# Patient Record
Sex: Female | Born: 1985 | Race: Black or African American | Hispanic: No | Marital: Married | Smoking: Never smoker
Health system: Southern US, Community
[De-identification: ages and names within clinical notes are randomized; demographics above are authoritative.]

## PROBLEM LIST (undated history)

## (undated) DIAGNOSIS — Z789 Other specified health status: Secondary | ICD-10-CM

## (undated) HISTORY — DX: Other specified health status: Z78.9

---

## 2013-01-05 LAB — OB RESULTS CONSOLE HEPATITIS B SURFACE ANTIGEN: Hepatitis B Surface Ag: NEGATIVE

## 2013-01-05 LAB — OB RESULTS CONSOLE RPR: RPR: NONREACTIVE

## 2013-01-05 LAB — OB RESULTS CONSOLE ABO/RH: RH Type: POSITIVE

## 2013-01-05 LAB — OB RESULTS CONSOLE RUBELLA ANTIBODY, IGM: Rubella: IMMUNE

## 2013-01-05 LAB — OB RESULTS CONSOLE ANTIBODY SCREEN: Antibody Screen: NEGATIVE

## 2013-01-27 ENCOUNTER — Inpatient Hospital Stay (HOSPITAL_COMMUNITY): Admission: AD | Admit: 2013-01-27 | Payer: Self-pay | Source: Ambulatory Visit | Admitting: Obstetrics and Gynecology

## 2013-07-16 LAB — OB RESULTS CONSOLE GBS: GBS: NEGATIVE

## 2013-08-02 ENCOUNTER — Ambulatory Visit (INDEPENDENT_AMBULATORY_CARE_PROVIDER_SITE_OTHER): Payer: Self-pay | Admitting: Pediatrics

## 2013-08-02 DIAGNOSIS — Z7189 Other specified counseling: Secondary | ICD-10-CM | POA: Insufficient documentation

## 2013-08-02 NOTE — Progress Notes (Signed)
Pre natal counseling done 

## 2013-08-04 ENCOUNTER — Encounter (HOSPITAL_COMMUNITY): Payer: Self-pay | Admitting: *Deleted

## 2013-08-04 ENCOUNTER — Telehealth (HOSPITAL_COMMUNITY): Payer: Self-pay | Admitting: *Deleted

## 2013-08-04 NOTE — Telephone Encounter (Signed)
Preadmission screen  

## 2013-08-05 ENCOUNTER — Encounter (HOSPITAL_COMMUNITY): Payer: Self-pay | Admitting: *Deleted

## 2013-08-05 ENCOUNTER — Inpatient Hospital Stay (HOSPITAL_COMMUNITY)
Admission: AD | Admit: 2013-08-05 | Discharge: 2013-08-05 | Disposition: A | Discharge status: Home / Self Care | Payer: BC Managed Care – PPO | Source: Ambulatory Visit | Attending: Obstetrics and Gynecology | Admitting: Obstetrics and Gynecology

## 2013-08-05 DIAGNOSIS — R51 Headache: Secondary | ICD-10-CM | POA: Insufficient documentation

## 2013-08-05 DIAGNOSIS — O479 False labor, unspecified: Secondary | ICD-10-CM | POA: Insufficient documentation

## 2013-08-05 DIAGNOSIS — O368190 Decreased fetal movements, unspecified trimester, not applicable or unspecified: Secondary | ICD-10-CM | POA: Insufficient documentation

## 2013-08-05 LAB — CBC
HCT: 35.4 % — ABNORMAL LOW (ref 36.0–46.0)
MCHC: 33.6 g/dL (ref 30.0–36.0)
MCV: 79.2 fL (ref 78.0–100.0)
Platelets: 244 10*3/uL (ref 150–400)
RBC: 4.47 MIL/uL (ref 3.87–5.11)
RDW: 14.4 % (ref 11.5–15.5)
WBC: 10 10*3/uL (ref 4.0–10.5)

## 2013-08-05 LAB — COMPREHENSIVE METABOLIC PANEL
ALT: 20 U/L (ref 0–35)
AST: 34 U/L (ref 0–37)
Albumin: 3.1 g/dL — ABNORMAL LOW (ref 3.5–5.2)
Alkaline Phosphatase: 148 U/L — ABNORMAL HIGH (ref 39–117)
BUN: 4 mg/dL — ABNORMAL LOW (ref 6–23)
Chloride: 101 mEq/L (ref 96–112)
Potassium: 3.9 mEq/L (ref 3.5–5.1)
Sodium: 135 mEq/L (ref 135–145)
Total Bilirubin: 0.3 mg/dL (ref 0.3–1.2)
Total Protein: 7.4 g/dL (ref 6.0–8.3)

## 2013-08-05 NOTE — MAU Note (Signed)
Patient is in with c/o ctx q2-3 mins for hours and bloody show. She denies LOF. C/o decreased fetal movement. She states that she was closed on Tuesday, denies any complications. Patient does have intermittent headache and epigastric discomfort at times.

## 2013-08-11 ENCOUNTER — Encounter (HOSPITAL_COMMUNITY): Payer: Self-pay | Admitting: Family

## 2013-08-11 ENCOUNTER — Inpatient Hospital Stay (HOSPITAL_COMMUNITY)
Admission: AD | Admit: 2013-08-11 | Discharge: 2013-08-14 | DRG: 766 | Disposition: A | Discharge status: Home / Self Care | Payer: BC Managed Care – PPO | Source: Ambulatory Visit | Attending: Obstetrics and Gynecology | Admitting: Obstetrics and Gynecology

## 2013-08-11 ENCOUNTER — Encounter (HOSPITAL_COMMUNITY)
Admission: AD | Disposition: A | Discharge status: Home / Self Care | Payer: Self-pay | Source: Ambulatory Visit | Attending: Obstetrics and Gynecology

## 2013-08-11 ENCOUNTER — Inpatient Hospital Stay (HOSPITAL_COMMUNITY): Admission: RE | Admit: 2013-08-11 | Payer: BC Managed Care – PPO | Source: Ambulatory Visit

## 2013-08-11 ENCOUNTER — Inpatient Hospital Stay (HOSPITAL_COMMUNITY): Payer: BC Managed Care – PPO | Admitting: Anesthesiology

## 2013-08-11 ENCOUNTER — Encounter (HOSPITAL_COMMUNITY): Payer: BC Managed Care – PPO | Admitting: Anesthesiology

## 2013-08-11 DIAGNOSIS — IMO0001 Reserved for inherently not codable concepts without codable children: Secondary | ICD-10-CM

## 2013-08-11 DIAGNOSIS — O368190 Decreased fetal movements, unspecified trimester, not applicable or unspecified: Secondary | ICD-10-CM | POA: Diagnosis present

## 2013-08-11 DIAGNOSIS — Z98891 History of uterine scar from previous surgery: Secondary | ICD-10-CM

## 2013-08-11 LAB — CBC
HCT: 39.3 % (ref 36.0–46.0)
Hemoglobin: 13.2 g/dL (ref 12.0–15.0)
MCH: 26.5 pg (ref 26.0–34.0)
MCHC: 33.6 g/dL (ref 30.0–36.0)
MCV: 78.8 fL (ref 78.0–100.0)
RBC: 4.99 MIL/uL (ref 3.87–5.11)
RDW: 14.5 % (ref 11.5–15.5)

## 2013-08-11 SURGERY — Surgical Case
Anesthesia: Spinal

## 2013-08-11 MED ORDER — FENTANYL CITRATE 0.05 MG/ML IJ SOLN
INTRAMUSCULAR | Status: DC | PRN
  Administered 2013-08-11: 25 ug via INTRATHECAL

## 2013-08-11 MED ORDER — ACETAMINOPHEN 325 MG PO TABS: 650.0000 mg | ORAL_TABLET | ORAL | Status: DC | PRN

## 2013-08-11 MED ORDER — CEFAZOLIN SODIUM-DEXTROSE 2-3 GM-% IV SOLR
INTRAVENOUS | Status: AC
  Filled 2013-08-11: qty 50

## 2013-08-11 MED ORDER — DIPHENHYDRAMINE HCL 25 MG PO CAPS: 25.0000 mg | ORAL_CAPSULE | Freq: Four times a day (QID) | ORAL | Status: DC | PRN

## 2013-08-11 MED ORDER — IBUPROFEN 600 MG PO TABS: 600.0000 mg | ORAL_TABLET | Freq: Four times a day (QID) | ORAL | Status: DC | PRN

## 2013-08-11 MED ORDER — NALOXONE HCL 1 MG/ML IJ SOLN
1.0000 ug/kg/h | INTRAVENOUS | Status: DC | PRN
  Filled 2013-08-11: qty 2

## 2013-08-11 MED ORDER — MORPHINE SULFATE (PF) 0.5 MG/ML IJ SOLN
INTRAMUSCULAR | Status: DC | PRN
  Administered 2013-08-11: .15 mg via INTRATHECAL

## 2013-08-11 MED ORDER — NALBUPHINE SYRINGE 5 MG/0.5 ML
5.0000 mg | INJECTION | INTRAMUSCULAR | Status: DC | PRN
  Filled 2013-08-11: qty 1

## 2013-08-11 MED ORDER — ONDANSETRON HCL 4 MG/2ML IJ SOLN: 4.0000 mg | Freq: Four times a day (QID) | INTRAMUSCULAR | Status: DC | PRN

## 2013-08-11 MED ORDER — FLEET ENEMA 7-19 GM/118ML RE ENEM: 1.0000 | ENEMA | RECTAL | Status: DC | PRN

## 2013-08-11 MED ORDER — PRENATAL MULTIVITAMIN CH
1.0000 | ORAL_TABLET | Freq: Every day | ORAL | Status: DC
  Administered 2013-08-12 – 2013-08-13 (×2): 1 via ORAL
  Filled 2013-08-11 (×2): qty 1

## 2013-08-11 MED ORDER — SIMETHICONE 80 MG PO CHEW
80.0000 mg | CHEWABLE_TABLET | ORAL | Status: DC | PRN
Start: 2013-08-11 — End: 2013-08-14

## 2013-08-11 MED ORDER — DIPHENHYDRAMINE HCL 50 MG/ML IJ SOLN: 12.5000 mg | INTRAMUSCULAR | Status: DC | PRN

## 2013-08-11 MED ORDER — LACTATED RINGERS IV SOLN
INTRAVENOUS | Status: DC
  Administered 2013-08-11: 21:00:00 via INTRAVENOUS

## 2013-08-11 MED ORDER — SCOPOLAMINE 1 MG/3DAYS TD PT72
1.0000 | MEDICATED_PATCH | Freq: Once | TRANSDERMAL | Status: DC
  Administered 2013-08-11: 1.5 mg via TRANSDERMAL

## 2013-08-11 MED ORDER — IBUPROFEN 600 MG PO TABS
600.0000 mg | ORAL_TABLET | Freq: Four times a day (QID) | ORAL | Status: DC
  Administered 2013-08-11 – 2013-08-14 (×11): 600 mg via ORAL
  Filled 2013-08-11 (×11): qty 1

## 2013-08-11 MED ORDER — ONDANSETRON HCL 4 MG/2ML IJ SOLN: 4.0000 mg | INTRAMUSCULAR | Status: DC | PRN

## 2013-08-11 MED ORDER — KETOROLAC TROMETHAMINE 30 MG/ML IJ SOLN
30.0000 mg | Freq: Four times a day (QID) | INTRAMUSCULAR | Status: AC | PRN
  Administered 2013-08-11: 30 mg via INTRAVENOUS

## 2013-08-11 MED ORDER — SIMETHICONE 80 MG PO CHEW
80.0000 mg | CHEWABLE_TABLET | ORAL | Status: DC
  Administered 2013-08-12 – 2013-08-14 (×3): 80 mg via ORAL
  Filled 2013-08-11 (×3): qty 1

## 2013-08-11 MED ORDER — PHENYLEPHRINE HCL 10 MG/ML IJ SOLN
INTRAMUSCULAR | Status: DC | PRN
  Administered 2013-08-11: 80 ug via INTRAVENOUS
  Administered 2013-08-11: 40 ug via INTRAVENOUS
  Administered 2013-08-11: 80 ug via INTRAVENOUS

## 2013-08-11 MED ORDER — CEFAZOLIN SODIUM-DEXTROSE 2-3 GM-% IV SOLR
INTRAVENOUS | Status: DC | PRN
  Administered 2013-08-11: 2 g via INTRAVENOUS

## 2013-08-11 MED ORDER — SENNOSIDES-DOCUSATE SODIUM 8.6-50 MG PO TABS
2.0000 | ORAL_TABLET | ORAL | Status: DC
  Administered 2013-08-12 – 2013-08-14 (×3): 2 via ORAL
  Filled 2013-08-11 (×3): qty 2

## 2013-08-11 MED ORDER — LACTATED RINGERS IV SOLN
INTRAVENOUS | Status: DC
  Administered 2013-08-11 (×2): via INTRAVENOUS

## 2013-08-11 MED ORDER — INFLUENZA VAC SPLIT QUAD 0.5 ML IM SUSP: 0.5000 mL | INTRAMUSCULAR | Status: DC

## 2013-08-11 MED ORDER — ONDANSETRON HCL 4 MG/2ML IJ SOLN
INTRAMUSCULAR | Status: DC | PRN
  Administered 2013-08-11: 4 mg via INTRAVENOUS

## 2013-08-11 MED ORDER — LANOLIN HYDROUS EX OINT: 1.0000 "application " | TOPICAL_OINTMENT | CUTANEOUS | Status: DC | PRN

## 2013-08-11 MED ORDER — OXYCODONE-ACETAMINOPHEN 5-325 MG PO TABS
1.0000 | ORAL_TABLET | ORAL | Status: DC | PRN
  Administered 2013-08-12 – 2013-08-14 (×5): 1 via ORAL
  Filled 2013-08-11 (×2): qty 1
  Filled 2013-08-11: qty 2
  Filled 2013-08-11 (×2): qty 1

## 2013-08-11 MED ORDER — ONDANSETRON HCL 4 MG/2ML IJ SOLN
INTRAMUSCULAR | Status: AC
  Filled 2013-08-11: qty 2

## 2013-08-11 MED ORDER — KETOROLAC TROMETHAMINE 30 MG/ML IJ SOLN: 30.0000 mg | Freq: Four times a day (QID) | INTRAMUSCULAR | Status: AC | PRN

## 2013-08-11 MED ORDER — ONDANSETRON HCL 4 MG/2ML IJ SOLN: 4.0000 mg | Freq: Three times a day (TID) | INTRAMUSCULAR | Status: DC | PRN

## 2013-08-11 MED ORDER — LACTATED RINGERS IV SOLN: 500.0000 mL | Freq: Once | INTRAVENOUS | Status: DC

## 2013-08-11 MED ORDER — BUPIVACAINE HCL (PF) 0.25 % IJ SOLN
INTRAMUSCULAR | Status: AC
  Filled 2013-08-11: qty 30

## 2013-08-11 MED ORDER — MEPERIDINE HCL 25 MG/ML IJ SOLN
INTRAMUSCULAR | Status: DC | PRN
  Administered 2013-08-11: 25 mg via INTRAVENOUS

## 2013-08-11 MED ORDER — WITCH HAZEL-GLYCERIN EX PADS: 1.0000 "application " | MEDICATED_PAD | CUTANEOUS | Status: DC | PRN

## 2013-08-11 MED ORDER — SCOPOLAMINE 1 MG/3DAYS TD PT72
MEDICATED_PATCH | TRANSDERMAL | Status: AC
  Administered 2013-08-11: 1.5 mg via TRANSDERMAL
  Filled 2013-08-11: qty 1

## 2013-08-11 MED ORDER — METOCLOPRAMIDE HCL 5 MG/ML IJ SOLN: 10.0000 mg | Freq: Three times a day (TID) | INTRAMUSCULAR | Status: DC | PRN

## 2013-08-11 MED ORDER — OXYTOCIN 10 UNIT/ML IJ SOLN
INTRAMUSCULAR | Status: AC
  Filled 2013-08-11: qty 4

## 2013-08-11 MED ORDER — OXYTOCIN 10 UNIT/ML IJ SOLN
40.0000 [IU] | INTRAVENOUS | Status: DC | PRN
  Administered 2013-08-11: 40 [IU] via INTRAVENOUS

## 2013-08-11 MED ORDER — LIDOCAINE HCL (PF) 1 % IJ SOLN: 30.0000 mL | INTRAMUSCULAR | Status: DC | PRN

## 2013-08-11 MED ORDER — OXYTOCIN 40 UNITS IN LACTATED RINGERS INFUSION - SIMPLE MED: 62.5000 mL/h | INTRAVENOUS | Status: AC

## 2013-08-11 MED ORDER — MEPERIDINE HCL 25 MG/ML IJ SOLN
INTRAMUSCULAR | Status: AC
  Filled 2013-08-11: qty 1

## 2013-08-11 MED ORDER — DIPHENHYDRAMINE HCL 25 MG PO CAPS
25.0000 mg | ORAL_CAPSULE | ORAL | Status: DC | PRN
  Filled 2013-08-11: qty 1

## 2013-08-11 MED ORDER — EPHEDRINE 5 MG/ML INJ: 10.0000 mg | INTRAVENOUS | Status: DC | PRN

## 2013-08-11 MED ORDER — LACTATED RINGERS IV SOLN
INTRAVENOUS | Status: DC | PRN
  Administered 2013-08-11: 14:00:00 via INTRAVENOUS

## 2013-08-11 MED ORDER — OXYCODONE-ACETAMINOPHEN 5-325 MG PO TABS: 1.0000 | ORAL_TABLET | ORAL | Status: DC | PRN

## 2013-08-11 MED ORDER — FENTANYL 2.5 MCG/ML BUPIVACAINE 1/10 % EPIDURAL INFUSION (WH - ANES): 14.0000 mL/h | INTRAMUSCULAR | Status: DC | PRN

## 2013-08-11 MED ORDER — TETANUS-DIPHTH-ACELL PERTUSSIS 5-2.5-18.5 LF-MCG/0.5 IM SUSP: 0.5000 mL | Freq: Once | INTRAMUSCULAR | Status: DC

## 2013-08-11 MED ORDER — PHENYLEPHRINE 40 MCG/ML (10ML) SYRINGE FOR IV PUSH (FOR BLOOD PRESSURE SUPPORT): 80.0000 ug | PREFILLED_SYRINGE | INTRAVENOUS | Status: DC | PRN

## 2013-08-11 MED ORDER — PHENYLEPHRINE 40 MCG/ML (10ML) SYRINGE FOR IV PUSH (FOR BLOOD PRESSURE SUPPORT)
PREFILLED_SYRINGE | INTRAVENOUS | Status: AC
  Filled 2013-08-11: qty 5

## 2013-08-11 MED ORDER — DIBUCAINE 1 % RE OINT: 1.0000 "application " | TOPICAL_OINTMENT | RECTAL | Status: DC | PRN

## 2013-08-11 MED ORDER — MORPHINE SULFATE 0.5 MG/ML IJ SOLN
INTRAMUSCULAR | Status: AC
  Filled 2013-08-11: qty 10

## 2013-08-11 MED ORDER — FENTANYL CITRATE 0.05 MG/ML IJ SOLN
INTRAMUSCULAR | Status: AC
  Filled 2013-08-11: qty 2

## 2013-08-11 MED ORDER — ONDANSETRON HCL 4 MG PO TABS: 4.0000 mg | ORAL_TABLET | ORAL | Status: DC | PRN

## 2013-08-11 MED ORDER — CITRIC ACID-SODIUM CITRATE 334-500 MG/5ML PO SOLN
30.0000 mL | ORAL | Status: DC | PRN
  Administered 2013-08-11: 30 mL via ORAL
  Filled 2013-08-11: qty 15

## 2013-08-11 MED ORDER — DIPHENHYDRAMINE HCL 50 MG/ML IJ SOLN: 25.0000 mg | INTRAMUSCULAR | Status: DC | PRN

## 2013-08-11 MED ORDER — NALOXONE HCL 0.4 MG/ML IJ SOLN: 0.4000 mg | INTRAMUSCULAR | Status: DC | PRN

## 2013-08-11 MED ORDER — LACTATED RINGERS IV SOLN: 500.0000 mL | INTRAVENOUS | Status: DC | PRN

## 2013-08-11 MED ORDER — SODIUM CHLORIDE 0.9 % IJ SOLN: 3.0000 mL | INTRAMUSCULAR | Status: DC | PRN

## 2013-08-11 MED ORDER — PHENYLEPHRINE 8 MG IN D5W 100 ML (0.08MG/ML) PREMIX OPTIME: INJECTION | INTRAVENOUS | Status: DC | PRN

## 2013-08-11 MED ORDER — OXYTOCIN BOLUS FROM INFUSION: 500.0000 mL | INTRAVENOUS | Status: DC

## 2013-08-11 MED ORDER — MENTHOL 3 MG MT LOZG: 1.0000 | LOZENGE | OROMUCOSAL | Status: DC | PRN

## 2013-08-11 MED ORDER — ZOLPIDEM TARTRATE 5 MG PO TABS: 5.0000 mg | ORAL_TABLET | Freq: Every evening | ORAL | Status: DC | PRN

## 2013-08-11 MED ORDER — OXYTOCIN 40 UNITS IN LACTATED RINGERS INFUSION - SIMPLE MED: 62.5000 mL/h | INTRAVENOUS | Status: DC

## 2013-08-11 MED ORDER — MEPERIDINE HCL 25 MG/ML IJ SOLN: 6.2500 mg | INTRAMUSCULAR | Status: DC | PRN

## 2013-08-11 MED ORDER — FENTANYL CITRATE 0.05 MG/ML IJ SOLN
25.0000 ug | INTRAMUSCULAR | Status: DC | PRN
  Administered 2013-08-11 (×2): 50 ug via INTRAVENOUS

## 2013-08-11 MED ORDER — FENTANYL CITRATE 0.05 MG/ML IJ SOLN
INTRAMUSCULAR | Status: AC
  Administered 2013-08-11: 50 ug via INTRAVENOUS
  Filled 2013-08-11: qty 2

## 2013-08-11 MED ORDER — KETOROLAC TROMETHAMINE 30 MG/ML IJ SOLN
INTRAMUSCULAR | Status: AC
  Administered 2013-08-11: 30 mg via INTRAVENOUS
  Filled 2013-08-11: qty 1

## 2013-08-11 MED ORDER — SIMETHICONE 80 MG PO CHEW
80.0000 mg | CHEWABLE_TABLET | Freq: Three times a day (TID) | ORAL | Status: DC
  Administered 2013-08-12 – 2013-08-14 (×5): 80 mg via ORAL
  Filled 2013-08-11 (×6): qty 1

## 2013-08-11 MED ORDER — BUPIVACAINE IN DEXTROSE 0.75-8.25 % IT SOLN
INTRATHECAL | Status: DC | PRN
  Administered 2013-08-11: 1.8 mL via INTRATHECAL

## 2013-08-11 SURGICAL SUPPLY — 30 items
BARRIER ADHS 3X4 INTERCEED (GAUZE/BANDAGES/DRESSINGS) IMPLANT
CLAMP CORD UMBIL (MISCELLANEOUS) IMPLANT
CLOTH BEACON ORANGE TIMEOUT ST (SAFETY) ×2 IMPLANT
CONTAINER PREFILL 10% NBF 15ML (MISCELLANEOUS) IMPLANT
DRAPE LG THREE QUARTER DISP (DRAPES) IMPLANT
DRSG OPSITE POSTOP 4X10 (GAUZE/BANDAGES/DRESSINGS) ×2 IMPLANT
DURAPREP 26ML APPLICATOR (WOUND CARE) ×2 IMPLANT
ELECT REM PT RETURN 9FT ADLT (ELECTROSURGICAL) ×2
ELECTRODE REM PT RTRN 9FT ADLT (ELECTROSURGICAL) ×1 IMPLANT
EXTRACTOR VACUUM M CUP 4 TUBE (SUCTIONS) IMPLANT
GLOVE BIO SURGEON STRL SZ 6.5 (GLOVE) ×2 IMPLANT
GOWN PREVENTION PLUS XLARGE (GOWN DISPOSABLE) ×4 IMPLANT
GOWN STRL REIN XL XLG (GOWN DISPOSABLE) ×4 IMPLANT
KIT ABG SYR 3ML LUER SLIP (SYRINGE) IMPLANT
NEEDLE HYPO 22GX1.5 SAFETY (NEEDLE) IMPLANT
NEEDLE HYPO 25X5/8 SAFETYGLIDE (NEEDLE) ×2 IMPLANT
NS IRRIG 1000ML POUR BTL (IV SOLUTION) ×2 IMPLANT
PACK C SECTION WH (CUSTOM PROCEDURE TRAY) ×2 IMPLANT
PAD OB MATERNITY 4.3X12.25 (PERSONAL CARE ITEMS) ×2 IMPLANT
STAPLER VISISTAT 35W (STAPLE) IMPLANT
SUT CHROMIC 0 CTX 36 (SUTURE) ×4 IMPLANT
SUT PLAIN 0 NONE (SUTURE) IMPLANT
SUT PLAIN 2 0 XLH (SUTURE) IMPLANT
SUT VIC AB 0 CT1 27 (SUTURE) ×3
SUT VIC AB 0 CT1 27XBRD ANBCTR (SUTURE) ×3 IMPLANT
SUT VIC AB 4-0 KS 27 (SUTURE) IMPLANT
SYR CONTROL 10ML LL (SYRINGE) IMPLANT
TOWEL OR 17X24 6PK STRL BLUE (TOWEL DISPOSABLE) ×2 IMPLANT
TRAY FOLEY CATH 14FR (SET/KITS/TRAYS/PACK) ×2 IMPLANT
WATER STERILE IRR 1000ML POUR (IV SOLUTION) ×2 IMPLANT

## 2013-08-11 NOTE — Progress Notes (Signed)
At time of AROM, thick dark meconium noted. FHR had absent variability and questionable late decels Contraction pattern consistent with tachysystole Cervix is only 2 cm dilated Given remote from vag delivery recommend Primary LTCS Risks reviewed OR notified.

## 2013-08-11 NOTE — Transfer of Care (Signed)
Immediate Anesthesia Transfer of Care Note  Patient: Jacqueline Bowen  Procedure(s) Performed: Procedure(s): CESAREAN SECTION (N/A)  Patient Location: PACU  Anesthesia Type:Spinal  Level of Consciousness: awake, alert  and oriented  Airway & Oxygen Therapy: Patient Spontanous Breathing  Post-op Assessment: Report given to PACU RN and Post -op Vital signs reviewed and stable  Post vital signs: Reviewed and stable  Complications: No apparent anesthesia complications

## 2013-08-11 NOTE — Lactation Note (Signed)
This note was copied from the chart of Jacqueline Annah Rosner. Lactation Consultation Note Initial visit at 3 hours of age, Baby asleep in the crib.  [facility] LC resources given and discussed.  Hand expression demonstrated with small drop of colostrum visible.  Discussed feeding cues, skin to skin, small stomach size, cluster feeding and output increasing.  Referred to baby and me booklet for more information.  Mom to call for assist as needed.   Patient Name: Jacqueline Bowen Today's Date: 08/11/2013     Maternal Data Formula Feeding for Exclusion: No Infant to breast within first hour of birth: Yes Has patient been taught Hand Expression?: Yes Does the patient have breastfeeding experience prior to this delivery?: No  Feeding Feeding Type: Breast Fed Length of feed: 5 min  LATCH Score/Interventions Latch: Too sleepy or reluctant, no latch achieved, no sucking elicited. Intervention(s): Skin to skin  Audible Swallowing: None Intervention(s): Skin to skin  Type of Nipple: Flat  Comfort (Breast/Nipple): Soft / non-tender     Hold (Positioning): Full assist, staff holds infant at breast  LATCH Score: 3  Lactation Tools Discussed/Used     Consult Status Consult Status: Follow-up Date: 08/12/13 Follow-up type: In-patient    Jannifer Rodney 08/11/2013, 5:26 PM

## 2013-08-11 NOTE — Anesthesia Postprocedure Evaluation (Signed)
  Anesthesia Post-op Note  Patient: Jacqueline Bowen  Procedure(s) Performed: Procedure(s): CESAREAN SECTION (N/A)  Patient Location: PACU  Anesthesia Type:Spinal  Level of Consciousness: awake, alert  and oriented  Airway and Oxygen Therapy: Patient Spontanous Breathing  Post-op Pain: none  Post-op Assessment: Post-op Vital signs reviewed, Patient's Cardiovascular Status Stable, Respiratory Function Stable, Patent Airway, No signs of Nausea or vomiting, Pain level controlled, No headache and No backache  Post-op Vital Signs: Reviewed and stable  Complications: No apparent anesthesia complications

## 2013-08-11 NOTE — H&P (Signed)
Lakita Quiggle is a 27 y.o. female presenting for labor. Maternal Medical History:  Reason for admission: Contractions.   Contractions: Onset was 3-5 hours ago.   Frequency: regular.   Perceived severity is strong.    Fetal activity: Perceived fetal activity is normal.    Prenatal complications: no prenatal complications   OB History   Grav Para Term Preterm Abortions TAB SAB Ect Mult Living   1              Past Medical History  Diagnosis Date  . Medical history non-contributory    Past Surgical History  Procedure Laterality Date  . No past surgeries     Family History: family history is negative for Alcohol abuse, Arthritis, Asthma, Birth defects, Cancer, COPD, Depression, Diabetes, Drug abuse, Early death, Hearing loss, Heart disease, Hyperlipidemia, Hypertension, Kidney disease, Learning disabilities, Mental illness, Mental retardation, Miscarriages / Stillbirths, Stroke, Vision loss, Varicose Veins, and Heart attack. Social History:  reports that she has never smoked. She has never used smokeless tobacco. She reports that she does not drink alcohol or use illicit drugs.   Prenatal Transfer Tool  Maternal Diabetes: No Genetic Screening: Normal Maternal Ultrasounds/Referrals: Normal Fetal Ultrasounds or other Referrals:  None Maternal Substance Abuse:  No Significant Maternal Medications:  None Significant Maternal Lab Results:  None Other Comments:  None  Review of Systems  All other systems reviewed and are negative.    Dilation: Fingertip Effacement (%): Thick;10;20 Station: -3 Exam by:: C.Millican, RN  Blood pressure 111/66, pulse 93, temperature 97.6 F (36.4 C), temperature source Oral, resp. rate 18, height 5\' 4"  (1.626 m), weight 89.086 kg (196 lb 6.4 oz), last menstrual period 10/30/2012. Maternal Exam:  Uterine Assessment: Contraction strength is moderate.  Contraction frequency is regular.   Abdomen: Fetal presentation: vertex  Introitus:  Normal vulva.   Fetal Exam Fetal State Assessment: Category I - tracings are normal.     Physical Exam  Nursing note and vitals reviewed. Constitutional: She appears well-developed.  HENT:  Head: Normocephalic.  Eyes: Pupils are equal, round, and reactive to light.  Neck: Normal range of motion.  Cardiovascular: Normal rate.   GI: Soft.    Prenatal labs: ABO, Rh: O/Positive/-- (05/13 0000) Antibody: Negative (05/13 0000) Rubella: Immune (05/13 0000) RPR: Nonreactive (05/13 0000)  HBsAg: Negative (05/13 0000)  HIV: Non-reactive (05/13 0000)  GBS: Negative (11/21 0000)   Assessment/Plan; IUP at term Labor Meconium Patient declines pain meds currently Follow tracing carefully Discussed meconium with patient and her spouse   Azalia Neuberger L 08/11/2013, 1:16 PM

## 2013-08-11 NOTE — Brief Op Note (Signed)
08/11/2013  2:36 PM  PATIENT:  Jacqueline Bowen  27 y.o. female  PRE-OPERATIVE DIAGNOSIS: IUP at 40 weeks and 5 days Thick Meconium Early Labor Non Reassuring Fetal Heart Rate  POST-OPERATIVE DIAGNOSIS:  Same  PROCEDURE:  Procedure(s): CESAREAN SECTION (N/A)  SURGEON:  Surgeon(s) and Role:    * Jeani Hawking, MD - Primary  PHYSICIAN ASSISTANT:   ASSISTANTS: none   ANESTHESIA:   spinal  EBL:  Total I/O In: -  Out: 800 [Urine:300; Blood:500]  BLOOD ADMINISTERED:none  DRAINS: Urinary Catheter (Foley)   LOCAL MEDICATIONS USED:  NONE  SPECIMEN:  Source of Specimen:  placenta and cord ph  DISPOSITION OF SPECIMEN:  PATHOLOGY  COUNTS:  YES  TOURNIQUET:  * No tourniquets in log *  DICTATION: .Other Dictation: Dictation Number [id number]  PLAN OF CARE: Admit to inpatient   PATIENT DISPOSITION:  PACU - hemodynamically stable.   Delay start of Pharmacological VTE agent (>24hrs) due to surgical blood loss or risk of bleeding: not applicable

## 2013-08-11 NOTE — Anesthesia Preprocedure Evaluation (Signed)
Anesthesia Evaluation  Patient identified by MRN, date of birth, ID band Patient awake    Reviewed: Allergy & Precautions, H&P , NPO status , Patient's Chart, lab work & pertinent test results  Airway Mallampati: IV TM Distance: >3 FB Neck ROM: Full    Dental no notable dental hx. (+) Teeth Intact   Pulmonary neg pulmonary ROS,  breath sounds clear to auscultation  Pulmonary exam normal       Cardiovascular negative cardio ROS  Rhythm:Regular Rate:Normal     Neuro/Psych negative neurological ROS  negative psych ROS   GI/Hepatic Neg liver ROS, GERD-  Medicated and Controlled,  Endo/Other  negative endocrine ROS  Renal/GU negative Renal ROS  negative genitourinary   Musculoskeletal negative musculoskeletal ROS (+)   Abdominal (+) + obese,   Peds  Hematology negative hematology ROS (+)   Anesthesia Other Findings   Reproductive/Obstetrics (+) Pregnancy Non reassuring FHR tracing                           Anesthesia Physical Anesthesia Plan  ASA: II and emergent  Anesthesia Plan: Spinal   Post-op Pain Management:    Induction:   Airway Management Planned: Natural Airway  Additional Equipment:   Intra-op Plan:   Post-operative Plan:   Informed Consent: I have reviewed the patients History and Physical, chart, labs and discussed the procedure including the risks, benefits and alternatives for the proposed anesthesia with the patient or authorized representative who has indicated his/her understanding and acceptance.     Plan Discussed with: Anesthesiologist, CRNA and Surgeon  Anesthesia Plan Comments:         Anesthesia Quick Evaluation

## 2013-08-11 NOTE — Anesthesia Procedure Notes (Signed)
Spinal  Patient location during procedure: OR Start time: 08/11/2013 2:04 PM Staffing Anesthesiologist: Kimberly Nieland A. Performed by: anesthesiologist  Preanesthetic Checklist Completed: patient identified, site marked, surgical consent, pre-op evaluation, timeout performed, IV checked, risks and benefits discussed and monitors and equipment checked Spinal Block Patient position: sitting Prep: site prepped and draped and DuraPrep Patient monitoring: heart rate, cardiac monitor, continuous pulse ox and blood pressure Approach: midline Location: L3-4 Injection technique: single-shot Needle Needle type: Sprotte  Needle gauge: 24 G Needle length: 9 cm Needle insertion depth: 5 cm Assessment Sensory level: T4 Additional Notes Patient tolerated procedure well. Adequate sensory level.

## 2013-08-11 NOTE — MAU Note (Signed)
27 yo, G1P0 at [redacted]w[redacted]d, scheduled for induction today at 29. Reports contractions 3-4 minutes apart since last night. Reports bloody show; decreased fetal movement noted today, although good +fm last night.  Denies HSV.

## 2013-08-11 NOTE — MAU Note (Signed)
Patient states she is having frequent contractions and had bloody show this am. States she felt fetal movement this am, less now.

## 2013-08-12 ENCOUNTER — Encounter (HOSPITAL_COMMUNITY): Payer: Self-pay | Admitting: Obstetrics and Gynecology

## 2013-08-12 LAB — CBC
Hemoglobin: 10.4 g/dL — ABNORMAL LOW (ref 12.0–15.0)
Platelets: 235 10*3/uL (ref 150–400)
RBC: 3.92 MIL/uL (ref 3.87–5.11)
RDW: 14.4 % (ref 11.5–15.5)
WBC: 15 10*3/uL — ABNORMAL HIGH (ref 4.0–10.5)

## 2013-08-12 NOTE — Op Note (Signed)
NAMECOLBI, SCHILTZ NO.:  [id number]  MEDICAL RECORD NO.:  [id number]  LOCATION:  9109                          FACILITY:  [facility]  PHYSICIAN:  Shantanu Strauch L. Lindley Hiney, M.D.DATE OF BIRTH:  1986/03/14  DATE OF PROCEDURE:  08/11/2013 DATE OF DISCHARGE:                              OPERATIVE REPORT   PREOPERATIVE DIAGNOSES: 1. IUP at 40 weeks and 5 days. 2. Thick meconium. 3. Early labor. 4. Nonreassuring fetal heart rate.  POSTOPERATIVE DIAGNOSES: 1. IUP at 40 weeks and 5 days. 2. Thick meconium. 3. Early labor. 4. Nonreassuring fetal heart rate.  PROCEDURE:  Primary low-transverse cesarean section.  SURGEON:  Dr. Vincente Poli.  ANESTHESIA:  Spinal.  EBL:  400 mL.  COMPLICATIONS:  None.  DRAINS:  Foley catheter.  PROCEDURE:  The patient was urgently taken to the operating room, #1 Dr. Malen Gauze placed a spinal.  She was prepped and draped.  Foley catheter was inserted.  A time-out was performed.  A low transverse incision was made, carried down to the fascia.  Fascia scored in the midline, extended laterally.  Rectus muscles were separated in the midline.  The peritoneum was entered bluntly.  The peritoneal incision was then divided.  The lower uterine segment was identified and the bladder flap was created sharply and then digitally.  A low-transverse incision was made in the uterus.  A very thick particulate yellow-green brown meconium was noted.  The baby was in cephalic presentation, delivered easily with one gentle pull of the vacuum.  It was a female infant.  The baby was meconium stained.  The cord was clamped and cut.  The baby was handed to the awaiting NICU team and the baby was vigorous in the operating room.  Cord blood and cord pH was obtained.  The placenta was manually removed, noted to be intact with a three-vessel cord.  The cord was very flat.  The cord and the placenta was meconium stained.  It was sent to pathology.  The uterus was  exteriorized and cleared of all clots and debris.  Pitocin was given.  Antibiotics had been given according to standard protocol.  The incision was closed in 2 layers using 0-chromic in a running locked stitch.  The uterus was returned to the abdomen. Irrigation was performed.  Hemostasis was excellent.  The peritoneum was closed using 0-Vicryl.  The fascia was then closed using 0-Vicryl starting each corner meeting in the midline.  After irrigation of subcutaneous layer, the skin was closed with 3-0 Vicryl on a Keith needle.  Dermabond was applied.  All sponge, lap, and instrument counts were correct x2.  The patient went to recovery room in stable condition.     Valerian Jewel L. Vincente Poli, M.D.     Florestine Avers  D:  08/11/2013  T:  08/12/2013  Job:  [id number]

## 2013-08-12 NOTE — Progress Notes (Signed)
Subjective: Postpartum Day 1: Cesarean Delivery Patient reports tolerating PO.    Objective: Vital signs in last 24 hours: Temp:  [97.4 F (36.3 C)-98.4 F (36.9 C)] 97.6 F (36.4 C) (12/18 1610) Pulse Rate:  [72-105] 85 (12/18 0638) Resp:  [16-20] 18 (12/18 9604) BP: (88-135)/(40-83) 110/67 mmHg (12/18 0638) SpO2:  [97 %-100 %] 98 % (12/18 0315) Weight:  [195 lb (88.451 kg)-196 lb 6.4 oz (89.086 kg)] 195 lb (88.451 kg) (12/17 1700)  Physical Exam:  General: alert and cooperative Lochia: appropriate Uterine Fundus: firm Incision: honeycomb dressing CDI DVT Evaluation: No evidence of DVT seen on physical exam. Negative Homan's sign. No cords or calf tenderness. No significant calf/ankle edema.   Recent Labs  08/11/13 1235 08/12/13 0600  HGB 13.2 10.4*  HCT 39.3 31.3*    Assessment/Plan: Status post Cesarean section. Doing well postoperatively.  Continue current care.  Mahalia Dykes G 08/12/2013, 8:00 AM

## 2013-08-13 NOTE — Lactation Note (Signed)
This note was copied from the chart of Jacqueline Bowen. Lactation Consultation Note Follow up visit at 50 hours,  Mom denies pain or problems.  Latch scores 8-9, mom reports hearing swallows.  Baby has not stooled in > 24 hours. Encouraged mom to follow up with [facility] RN if no stool tonight. Breast feeding basics reviewed. Mom hand expressed large amounts of colostrum. Mom to call for assist as needed.   Patient Name: Jacqueline Margurette Brener [id number] Date: 08/13/2013 Reason for consult: Follow-up assessment   Maternal Data    Feeding Feeding Type: Breast Fed Length of feed: 20 min  LATCH Score/Interventions Latch: Grasps breast easily, tongue down, lips flanged, rhythmical sucking.  Audible Swallowing: A few with stimulation  Type of Nipple: Everted at rest and after stimulation  Comfort (Breast/Nipple): Soft / non-tender     Hold (Positioning): No assistance needed to correctly position infant at breast. Intervention(s): Breastfeeding basics reviewed  LATCH Score: 9  Lactation Tools Discussed/Used     Consult Status Consult Status: Follow-up Date: 08/14/13 Follow-up type: In-patient    Jannifer Rodney 08/13/2013, 4:41 PM

## 2013-08-13 NOTE — Progress Notes (Signed)
Subjective: Postpartum Day 2: Cesarean Delivery Patient reports tolerating PO, + flatus and no problems voiding.    Objective: Vital signs in last 24 hours: Temp:  [97.8 F (36.6 C)-98.5 F (36.9 C)] 98.5 F (36.9 C) (12/19 0532) Pulse Rate:  [82-102] 82 (12/19 0532) Resp:  [16-20] 18 (12/19 0532) BP: (95-105)/(61-63) 95/63 mmHg (12/19 0532) SpO2:  [98 %] 98 % (12/18 1100)  Physical Exam:  General: alert and cooperative Lochia: appropriate Uterine Fundus: firm Incision: healing well DVT Evaluation: No evidence of DVT seen on physical exam. Negative Homan's sign. No cords or calf tenderness. No significant calf/ankle edema.   Recent Labs  08/11/13 1235 08/12/13 0600  HGB 13.2 10.4*  HCT 39.3 31.3*    Assessment/Plan: Status post Cesarean section. Doing well postoperatively.  Continue current care.  Richie Vadala G 08/13/2013, 8:11 AM

## 2013-08-14 MED ORDER — IBUPROFEN 600 MG PO TABS: 600.0000 mg | ORAL_TABLET | Freq: Four times a day (QID) | ORAL | Status: DC

## 2013-08-14 MED ORDER — OXYCODONE-ACETAMINOPHEN 5-325 MG PO TABS: 1.0000 | ORAL_TABLET | ORAL | Status: DC | PRN

## 2013-08-14 NOTE — Lactation Note (Signed)
This note was copied from the chart of Girl Ladawna Hoecker. Lactation Consultation Note: Mom reports that baby just finished feeding and she is asleep in mom's arms. Reports that baby is latching on well- a little pain at the beginning of the feeding then eases off. Asking about pumping and bottle feeding EBM and milk storage- discussed with parents. Has Medela pump at home. Reports that her breasts are feeling slightly fuller this morning. No further questions at present. To call prn  Patient Name: Girl Oceania Noori [id number] Date: 08/14/2013 Reason for consult: Follow-up assessment   Maternal Data    Feeding Feeding Type: Breast Fed Length of feed: 10 min  LATCH Score/Interventions                      Lactation Tools Discussed/Used     Consult Status Consult Status: Complete    Pamelia Hoit 08/14/2013, 8:16 AM

## 2013-08-14 NOTE — Discharge Summary (Signed)
Obstetric Discharge Summary Reason for Admission: onset of labor Prenatal Procedures: none Intrapartum Procedures: cesarean: low cervical, transverse Postpartum Procedures: none Complications-Operative and Postpartum: none Hemoglobin  Date Value Range Status  08/12/2013 10.4* 12.0 - 15.0 g/dL Final     DELTA CHECK NOTED     REPEATED TO VERIFY     HCT  Date Value Range Status  08/12/2013 31.3* 36.0 - 46.0 % Final    Physical Exam:  General: alert Lochia: appropriate Uterine Fundus: firm Incision: healing well DVT Evaluation: No evidence of DVT seen on physical exam.  Discharge Diagnoses: Term Pregnancy-delivered  Discharge Information: Date: 08/14/2013 Activity: pelvic rest Diet: routine Medications: PNV, Ibuprofen and Percocet Condition: stable Instructions: refer to practice specific booklet Discharge to: home Follow-up Information   Follow up with Meriel Pica, MD. (has appt 12/24)    Specialty:  Obstetrics and Gynecology   Contact information:   [address] [place] [address] [phone]       Newborn Data: Live born female  Birth Weight: 7 lb 7.2 oz (3380 g) APGAR: 8, 9  Home with mother.  Murrell Elizondo M 08/14/2013, 11:15 AM

## 2013-08-18 ENCOUNTER — Encounter: Payer: Self-pay | Admitting: Pediatrics

## 2013-08-21 ENCOUNTER — Inpatient Hospital Stay (HOSPITAL_COMMUNITY)
Admission: AD | Admit: 2013-08-21 | Discharge: 2013-08-22 | Disposition: A | Discharge status: Home / Self Care | Payer: BC Managed Care – PPO | Source: Ambulatory Visit | Attending: Obstetrics and Gynecology | Admitting: Obstetrics and Gynecology

## 2013-08-21 DIAGNOSIS — R109 Unspecified abdominal pain: Secondary | ICD-10-CM | POA: Insufficient documentation

## 2013-08-21 NOTE — MAU Note (Signed)
I had C/S on 12/17. About 30 mins ago I had a lot of bleeding and passed some large clots. Having some cramping. Bleeding has slowed alittle now

## 2013-08-22 ENCOUNTER — Encounter (HOSPITAL_COMMUNITY): Payer: Self-pay | Admitting: *Deleted

## 2013-08-22 ENCOUNTER — Inpatient Hospital Stay (HOSPITAL_COMMUNITY): Payer: BC Managed Care – PPO

## 2013-08-22 LAB — CBC WITH DIFFERENTIAL/PLATELET
Basophils Absolute: 0 10*3/uL (ref 0.0–0.1)
Basophils Relative: 0 % (ref 0–1)
Lymphocytes Relative: 10 % — ABNORMAL LOW (ref 12–46)
Lymphs Abs: 1.3 10*3/uL (ref 0.7–4.0)
MCHC: 33.1 g/dL (ref 30.0–36.0)
MCV: 79.7 fL (ref 78.0–100.0)
Neutro Abs: 10.9 10*3/uL — ABNORMAL HIGH (ref 1.7–7.7)
Neutrophils Relative %: 84 % — ABNORMAL HIGH (ref 43–77)
Platelets: 376 10*3/uL (ref 150–400)
RBC: 3.6 MIL/uL — ABNORMAL LOW (ref 3.87–5.11)
WBC: 13.1 10*3/uL — ABNORMAL HIGH (ref 4.0–10.5)

## 2013-08-22 MED ORDER — IBUPROFEN 600 MG PO TABS
600.0000 mg | ORAL_TABLET | Freq: Once | ORAL | Status: AC
  Administered 2013-08-22: 600 mg via ORAL
  Filled 2013-08-22: qty 1

## 2013-08-22 MED ORDER — METHYLERGONOVINE MALEATE 0.2 MG PO TABS
0.2000 mg | ORAL_TABLET | Freq: Once | ORAL | Status: AC
  Administered 2013-08-22: 0.2 mg via ORAL
  Filled 2013-08-22: qty 1

## 2013-08-22 MED ORDER — METHYLERGONOVINE MALEATE 0.2 MG PO TABS: 0.2000 mg | ORAL_TABLET | Freq: Four times a day (QID) | ORAL | Status: AC

## 2013-08-22 NOTE — MAU Note (Signed)
Incision clean and dry and edges well approximated

## 2013-08-22 NOTE — Progress Notes (Signed)
Ivonne Andrew CNM in earlier and discussed test results and d/c plan. Written and verbal d/c instructions given and understanding voicedl.

## 2013-08-22 NOTE — MAU Provider Note (Signed)
Chief Complaint: Vaginal Bleeding   First Provider Initiated Contact with Patient 08/22/13 0325     SUBJECTIVE HPI: Jacqueline Bowen is a 27 y.o. G1P1001 at 10 days S/P primary LTCS for NRFHTs who presents to MAU with heavy bleeding and passing clots since 30 minutes prior to MAU visit. Less bleeing since then, but still moderate. Mild cramping. Denies fever, chills, significant abd pain, problems w/ incision, passage of tissue, N/V/D/C, urinary complaints or dizziness.   Past Medical History  Diagnosis Date  . Medical history non-contributory    OB History  Gravida Para Term Preterm AB SAB TAB Ectopic Multiple Living  1 1 1       1     # Outcome Date GA Lbr Len/2nd Weight Sex Delivery Anes PTL Lv  1 TRM 08/11/13 [redacted]w[redacted]d  3.38 kg (7 lb 7.2 oz) F LTCS Spinal  Y     Past Surgical History  Procedure Laterality Date  . No past surgeries    . Cesarean section N/A 08/11/2013    Procedure: CESAREAN SECTION;  Surgeon: Jeani Hawking, MD;  Location: [facility];  Service: Obstetrics;  Laterality: N/A;   History   Social History  . Marital Status: Married    Spouse Name: N/A    Number of Children: N/A  . Years of Education: N/A   Occupational History  . Not on file.   Social History Main Topics  . Smoking status: Never Smoker   . Smokeless tobacco: Never Used  . Alcohol Use: No  . Drug Use: No  . Sexual Activity: No   Other Topics Concern  . Not on file   Social History Narrative  . No narrative on file   No current facility-administered medications on file prior to encounter.   Current Outpatient Prescriptions on File Prior to Encounter  Medication Sig Dispense Refill  . ibuprofen (ADVIL,MOTRIN) 600 MG tablet Take 1 tablet (600 mg total) by mouth every 6 (six) hours.  30 tablet  1  . oxyCODONE-acetaminophen (PERCOCET/ROXICET) 5-325 MG per tablet Take 1-2 tablets by mouth every 4 (four) hours as needed for severe pain (moderate - severe pain).  30 tablet  0  . Prenatal Vit-Fe  Fumarate-FA (PRENATAL MULTIVITAMIN) TABS tablet Take 1 tablet by mouth daily at 12 noon.       No Known Allergies  ROS: Pertinent items in HPI  OBJECTIVE Blood pressure 121/80, pulse 87, temperature 98.1 F (36.7 C), temperature source Oral, resp. rate 18, height 5\' 5"  (1.651 m), weight 82.555 kg (182 lb), last menstrual period 10/30/2012. GENERAL: Well-developed, well-nourished female in no acute distress. Normal color for race. HEENT: Normocephalic HEART: normal rate RESP: normal effort ABDOMEN: Soft, non-tender. Well-healing LTCS incision. No erythema, drainage or bleeding.  EXTREMITIES: Nontender, no edema NEURO: Alert and oriented SPECULUM EXAM: NEFG, small amount of dark red blood and 1 2x4 cm clot. Normal odor, cervix clean BIMANUAL: cervix closed; uterus 13-week size, mildly tender.   LAB RESULTS Results for orders placed during the hospital encounter of 08/21/13 (from the past 24 hour(s))  CBC WITH DIFFERENTIAL     Status: Abnormal   Collection Time    08/22/13 12:19 AM      Result Value Range   WBC 13.1 (*) 4.0 - 10.5 K/uL   RBC 3.60 (*) 3.87 - 5.11 MIL/uL   Hemoglobin 9.5 (*) 12.0 - 15.0 g/dL   HCT 16.1 (*) 09.6 - 04.5 %   MCV 79.7  78.0 - 100.0 fL   MCH 26.4  26.0 - 34.0 pg   MCHC 33.1  30.0 - 36.0 g/dL   RDW 91.4  78.2 - 95.6 %   Platelets 376  150 - 400 K/uL   Neutrophils Relative % 84 (*) 43 - 77 %   Neutro Abs 10.9 (*) 1.7 - 7.7 K/uL   Lymphocytes Relative 10 (*) 12 - 46 %   Lymphs Abs 1.3  0.7 - 4.0 K/uL   Monocytes Relative 6  3 - 12 %   Monocytes Absolute 0.8  0.1 - 1.0 K/uL   Eosinophils Relative 1  0 - 5 %   Eosinophils Absolute 0.1  0.0 - 0.7 K/uL   Basophils Relative 0  0 - 1 %   Basophils Absolute 0.0  0.0 - 0.1 K/uL    IMAGING US Transvaginal Non-ob  08/22/2013   CLINICAL DATA:  Vaginal bleeding and pelvic cramping. Assess for retained products of conception.  EXAM: TRANSVAGINAL ULTRASOUND OF PELVIS  TECHNIQUE: Transvaginal ultrasound  examination of the pelvis was performed including evaluation of the uterus, ovaries, adnexal regions, and pelvic cul-de-sac.  COMPARISON:  None.  FINDINGS: Uterus  Measurements: 13.2 x 6.4 x 8.5 cm. No fibroids or other mass visualized. Complex fluid is noted within the endometrial canal. Clot is seen extending to the C-section incision.  Endometrium  Thickness: Not well characterized. No focal abnormality visualized.  Right ovary  Not visualized.  Left ovary  Not visualized.  Other findings:  No free fluid is seen within the pelvic cul-de-sac.  IMPRESSION: No evidence of retained products of conception. Complex fluid noted within the endometrial canal. Clot seen extending to the C-section incision.   Electronically Signed   By: Roanna Raider M.D.   On: 08/22/2013 03:22   Normal findings for 10 week post-op C/S per radiologist.   MAU COURSE Pelvic US, Methergine Ibuprofen. Small amount of bleeding for remainder of MAU visit.   ASSESSMENT 1. Abnormal uterine bleeding, postpartum    PLAN Discharge home in stable condition. Bleeding precautions.  Take Methergine every 6 hours for the next 24 hours. You will probably have cramping after each dose. Take Ibuprofen as needed. Call you doctor's office or come to MAU for fever higher than 100.4, heavy bleeding or worsening abdominal pain.  Increase fluids and iron-rich foods.      Follow-up Information   Follow up with [facility] [place]. On 09/15/2013. (as scheduled or as needed if symptoms worsen)    Contact information:   [address] [place] [address] [phone]      Follow up with [facility] [place] [facility]ERN[facility] ADMISSIONS. (As needed if symptoms worsen)    Contact information:   [id number] [place] [address] [phone]       Medication List         ibuprofen 600 MG tablet  Commonly known as:  ADVIL,MOTRIN  Take 1 tablet (600 mg total) by mouth  every 6 (six) hours.     methylergonovine 0.2 MG tablet  Commonly known as:  METHERGINE  Take 1 tablet (0.2 mg total) by mouth 4 (four) times daily.     oxyCODONE-acetaminophen 5-325 MG per tablet  Commonly known as:  PERCOCET/ROXICET  Take 1-2 tablets by mouth every 4 (four) hours as needed for severe pain (moderate - severe pain).     prenatal multivitamin Tabs tablet  Take 1 tablet by mouth daily at 12 noon.        [place]  Elvina Mattes 08/22/2013  4:35 AM

## 2013-08-28 ENCOUNTER — Encounter: Payer: Self-pay | Admitting: Pediatrics

## 2014-06-27 ENCOUNTER — Encounter (HOSPITAL_COMMUNITY): Payer: Self-pay | Admitting: *Deleted

## 2015-07-21 ENCOUNTER — Inpatient Hospital Stay (HOSPITAL_COMMUNITY): Payer: Medicaid Other

## 2015-07-21 ENCOUNTER — Encounter (HOSPITAL_COMMUNITY): Payer: Self-pay

## 2015-07-21 ENCOUNTER — Inpatient Hospital Stay (HOSPITAL_COMMUNITY)
Admission: AD | Admit: 2015-07-21 | Discharge: 2015-07-21 | Disposition: A | Discharge status: Home / Self Care | Payer: Medicaid Other | Source: Ambulatory Visit | Attending: Family Medicine | Admitting: Family Medicine

## 2015-07-21 DIAGNOSIS — O209 Hemorrhage in early pregnancy, unspecified: Secondary | ICD-10-CM | POA: Diagnosis present

## 2015-07-21 DIAGNOSIS — O3680X0 Pregnancy with inconclusive fetal viability, not applicable or unspecified: Secondary | ICD-10-CM

## 2015-07-21 DIAGNOSIS — O200 Threatened abortion: Secondary | ICD-10-CM | POA: Diagnosis not present

## 2015-07-21 LAB — URINE MICROSCOPIC-ADD ON: WBC UA: NONE SEEN WBC/hpf (ref 0–5)

## 2015-07-21 LAB — CBC
HCT: 34.7 % — ABNORMAL LOW (ref 36.0–46.0)
HEMOGLOBIN: 11.4 g/dL — AB (ref 12.0–15.0)
MCH: 26.3 pg (ref 26.0–34.0)
MCHC: 32.9 g/dL (ref 30.0–36.0)
MCV: 80 fL (ref 78.0–100.0)
Platelets: 232 10*3/uL (ref 150–400)
RBC: 4.34 MIL/uL (ref 3.87–5.11)
RDW: 13.1 % (ref 11.5–15.5)
WBC: 4 10*3/uL (ref 4.0–10.5)

## 2015-07-21 LAB — URINALYSIS, ROUTINE W REFLEX MICROSCOPIC
BILIRUBIN URINE: NEGATIVE
Glucose, UA: NEGATIVE mg/dL
Ketones, ur: NEGATIVE mg/dL
Leukocytes, UA: NEGATIVE
Nitrite: NEGATIVE
PH: 6 (ref 5.0–8.0)
Protein, ur: NEGATIVE mg/dL
SPECIFIC GRAVITY, URINE: 1.025 (ref 1.005–1.030)

## 2015-07-21 LAB — HCG, QUANTITATIVE, PREGNANCY: hCG, Beta Chain, Quant, S: 11438 m[IU]/mL — ABNORMAL HIGH (ref <5)

## 2015-07-21 LAB — POCT PREGNANCY, URINE: PREG TEST UR: POSITIVE — AB

## 2015-07-21 NOTE — MAU Note (Signed)
Patient had positive home UPT, started having light vaginal bleeding since last night.

## 2015-07-21 NOTE — Discharge Instructions (Signed)

## 2015-07-22 LAB — HIV ANTIBODY (ROUTINE TESTING W REFLEX): HIV Screen 4th Generation wRfx: NONREACTIVE

## 2015-07-23 ENCOUNTER — Inpatient Hospital Stay (HOSPITAL_COMMUNITY)
Admission: AD | Admit: 2015-07-23 | Discharge: 2015-07-23 | Disposition: A | Discharge status: Home / Self Care | Payer: Medicaid Other | Source: Ambulatory Visit | Attending: Obstetrics & Gynecology | Admitting: Obstetrics & Gynecology

## 2015-07-23 ENCOUNTER — Encounter (HOSPITAL_COMMUNITY): Payer: Self-pay

## 2015-07-23 DIAGNOSIS — O021 Missed abortion: Secondary | ICD-10-CM | POA: Diagnosis not present

## 2015-07-23 DIAGNOSIS — Z3A14 14 weeks gestation of pregnancy: Secondary | ICD-10-CM | POA: Diagnosis not present

## 2015-07-23 LAB — CBC
HCT: 35.4 % — ABNORMAL LOW (ref 36.0–46.0)
Hemoglobin: 11.6 g/dL — ABNORMAL LOW (ref 12.0–15.0)
MCH: 26.2 pg (ref 26.0–34.0)
MCHC: 32.8 g/dL (ref 30.0–36.0)
MCV: 80.1 fL (ref 78.0–100.0)
PLATELETS: 239 10*3/uL (ref 150–400)
RBC: 4.42 MIL/uL (ref 3.87–5.11)
RDW: 13.2 % (ref 11.5–15.5)
WBC: 5.8 10*3/uL (ref 4.0–10.5)

## 2015-07-23 LAB — URINALYSIS, ROUTINE W REFLEX MICROSCOPIC
BILIRUBIN URINE: NEGATIVE
Glucose, UA: NEGATIVE mg/dL
Ketones, ur: NEGATIVE mg/dL
Leukocytes, UA: NEGATIVE
Nitrite: NEGATIVE
PH: 5.5 (ref 5.0–8.0)
Protein, ur: NEGATIVE mg/dL
SPECIFIC GRAVITY, URINE: 1.025 (ref 1.005–1.030)

## 2015-07-23 LAB — URINE MICROSCOPIC-ADD ON
BACTERIA UA: NONE SEEN
WBC, UA: NONE SEEN WBC/hpf (ref 0–5)

## 2015-07-23 LAB — HCG, QUANTITATIVE, PREGNANCY: HCG, BETA CHAIN, QUANT, S: 8983 m[IU]/mL — AB (ref <5)

## 2015-07-23 MED ORDER — OXYCODONE-ACETAMINOPHEN 5-325 MG PO TABS: 1.0000 | ORAL_TABLET | Freq: Four times a day (QID) | ORAL | Status: DC | PRN

## 2015-07-23 MED ORDER — IBUPROFEN 600 MG PO TABS: 600.0000 mg | ORAL_TABLET | Freq: Four times a day (QID) | ORAL | Status: DC | PRN

## 2015-07-23 NOTE — MAU Note (Signed)
Pt states having some lower abdominal pain and heavier bleeding. Passed some small clots yesterday.

## 2015-07-23 NOTE — Discharge Instructions (Signed)
Incomplete Miscarriage A miscarriage is the sudden loss of an unborn baby (fetus) before the 20th week of pregnancy. In an incomplete miscarriage, parts of the fetus or placenta (afterbirth) remain in the body.  Having a miscarriage can be an emotional experience. Talk with your health care provider about any questions you may have about miscarrying, the grieving process, and your future pregnancy plans. CAUSES   Problems with the fetal chromosomes that make it impossible for the baby to develop normally. Problems with the baby's genes or chromosomes are most often the result of errors that occur by chance as the embryo divides and grows. The problems are not inherited from the parents.  Infection of the cervix or uterus.  Hormone problems.  Problems with the cervix, such as having an incompetent cervix. This is when the tissue in the cervix is not strong enough to hold the pregnancy.  Problems with the uterus, such as an abnormally shaped uterus, uterine fibroids, or congenital abnormalities.  Certain medical conditions.  Smoking, drinking alcohol, or taking illegal drugs.  Trauma. SYMPTOMS   Vaginal bleeding or spotting, with or without cramps or pain.  Pain or cramping in the abdomen or lower back.  Passing fluid, tissue, or blood clots from the vagina. DIAGNOSIS  Your health care provider will perform a physical exam. You may also have an ultrasound to confirm the miscarriage. Blood or urine tests may also be ordered. TREATMENT   Usually, a dilation and curettage (D&C) procedure is performed. During a D&C procedure, the cervix is widened (dilated) and any remaining fetal or placental tissue is gently removed from the uterus.  Antibiotic medicines are prescribed if there is an infection. Other medicines may be given to reduce the size of the uterus (contract) if there is a lot of bleeding.  If you have Rh negative blood and your baby was Rh positive, you will need a Rho (D)  immune globulin shot. This shot will protect any future baby from having Rh blood problems in future pregnancies.  You may be confined to bed rest. This means you should stay in bed and only get up to use the bathroom. HOME CARE INSTRUCTIONS   Rest as directed by your health care provider.  Restrict activity as directed by your health care provider. You may be allowed to continue light activity if curettage was not done but you require further treatment.  Keep track of the number of pads you use each day. Keep track of how soaked (saturated) they are. Record this information.  Do not  use tampons.  Do not douche or have sexual intercourse until approved by your health care provider.  Keep all follow-up appointments for reevaluation and continuing management.  Only take over-the-counter or prescription medicines for pain, fever, or discomfort as directed by your health care provider.  Take antibiotic medicine as directed by your health care provider. Make sure you finish it even if you start to feel better. SEEK IMMEDIATE MEDICAL CARE IF:   You experience severe cramps in your stomach, back, or abdomen.  You have an unexplained temperature (make sure to record these temperatures).  You pass large clots or tissue (save these for your health care provider to inspect).  Your bleeding increases.  You become light-headed, weak, or have fainting episodes. MAKE SURE YOU:   Understand these instructions.  Will watch your condition.  Will get help right away if you are not doing well or get worse.   This information is not intended to   replace advice given to you by your health care provider. Make sure you discuss any questions you have with your health care provider.   Document Released: 08/12/2005 Document Revised: 09/02/2014 Document Reviewed: 03/11/2013 Elsevier Interactive Patient Education 2016 [employer]vier Inc.  

## 2015-07-23 NOTE — MAU Provider Note (Signed)
History     CSN: [id number]  Arrival date and time: 07/23/15 [id number]   First Provider Initiated Contact with Patient 07/23/15 [phone]      Chief Complaint  Patient presents with  . Abdominal Pain   HPI [name] is a 29 y.o. G3P1001 at 43110w3d who presents to MAU today with complaint of vaginal bleeding or abdominal pain. The patient was seen in MAU with the same symptoms on 07/21/15. US that day showed IUGS without YS or FP and quant hCG was [id number]. She states that bleeding was heavier last night and early this morning with few small clots. She feels it is lighter now. She continues to have pain off and on that comes in waves. She has not taken anything for pain. She denies fever.   OB History    Gravida Para Term Preterm AB TAB SAB Ectopic Multiple Living   2 1 1       1       Past Medical History  Diagnosis Date  . Medical history non-contributory     Past Surgical History  Procedure Laterality Date  . No past surgeries    . Cesarean section N/A 08/11/2013    Procedure: CESAREAN SECTION;  Surgeon: Jeani HawkingMichelle L Grewal, MD;  Location: [facility];  Service: Obstetrics;  Laterality: N/A;    Family History  Problem Relation Age of Onset  . Alcohol abuse Neg Hx   . Arthritis Neg Hx   . Asthma Neg Hx   . Birth defects Neg Hx   . Cancer Neg Hx   . COPD Neg Hx   . Depression Neg Hx   . Diabetes Neg Hx   . Drug abuse Neg Hx   . Early death Neg Hx   . Hearing loss Neg Hx   . Heart disease Neg Hx   . Hyperlipidemia Neg Hx   . Hypertension Neg Hx   . Kidney disease Neg Hx   . Learning disabilities Neg Hx   . Mental illness Neg Hx   . Mental retardation Neg Hx   . Miscarriages / Stillbirths Neg Hx   . Stroke Neg Hx   . Vision loss Neg Hx   . Varicose Veins Neg Hx   . Heart attack Neg Hx     Social History  Substance Use Topics  . Smoking status: Never Smoker   . Smokeless tobacco: Never Used  . Alcohol Use: No    Allergies: No Known Allergies  Prescriptions  prior to admission  Medication Sig Dispense Refill Last Dose  . Prenatal Vit-Fe Fumarate-FA (PRENATAL MULTIVITAMIN) TABS tablet Take 1 tablet by mouth daily at 12 noon.   07/22/2015 at Unknown time    Review of Systems  Constitutional: Negative for fever and malaise/fatigue.  Gastrointestinal: Positive for abdominal pain. Negative for nausea and vomiting.  Genitourinary:       + vaginal bleeding   Physical Exam   Blood pressure 114/69, pulse 77, temperature 97.4 F (36.3 C), temperature source Oral, resp. rate 16, height 5\' 5"  (1.651 m), weight 151 lb 6.4 oz (68.675 kg), last menstrual period 05/03/2015, SpO2 100 %.  Physical Exam  Nursing note and vitals reviewed. Constitutional: She is oriented to person, place, and time. She appears well-developed and well-nourished. No distress.  HENT:  Head: Normocephalic and atraumatic.  Cardiovascular: Normal rate.   Respiratory: Effort normal.  GI: Soft. She exhibits no distension and no mass. There is no tenderness. There is no rebound and no guarding.  Genitourinary: Uterus is not enlarged and not tender. Cervix exhibits no motion tenderness, no discharge and no friability. Right adnexum displays no mass and no tenderness. Left adnexum displays no mass and no tenderness. There is bleeding (small amount of blood with one small clot noted in the vaginal vault) in the vagina. No vaginal discharge found.  Cervix: closed, thick  Neurological: She is alert and oriented to person, place, and time.  Skin: Skin is warm and dry. No erythema.  Psychiatric: She has a normal mood and affect.   Results for orders placed or performed during the hospital encounter of 07/23/15 (from the past 24 hour(s))  Urinalysis, Routine w reflex microscopic (not at [facility])     Status: Abnormal   Collection Time: 07/23/15  6:38 AM  Result Value Ref Range   Color, Urine YELLOW YELLOW   APPearance CLEAR CLEAR   Specific Gravity, Urine 1.025 1.005 - 1.030   pH 5.5 5.0 -  8.0   Glucose, UA NEGATIVE NEGATIVE mg/dL   Hgb urine dipstick LARGE (A) NEGATIVE   Bilirubin Urine NEGATIVE NEGATIVE   Ketones, ur NEGATIVE NEGATIVE mg/dL   Protein, ur NEGATIVE NEGATIVE mg/dL   Nitrite NEGATIVE NEGATIVE   Leukocytes, UA NEGATIVE NEGATIVE  Urine microscopic-add on     Status: Abnormal   Collection Time: 07/23/15  6:38 AM  Result Value Ref Range   Squamous Epithelial / LPF 0-5 (A) NONE SEEN   WBC, UA NONE SEEN 0 - 5 WBC/hpf   RBC / HPF 6-30 0 - 5 RBC/hpf   Bacteria, UA NONE SEEN NONE SEEN  CBC     Status: Abnormal   Collection Time: 07/23/15  6:57 AM  Result Value Ref Range   WBC 5.8 4.0 - 10.5 K/uL   RBC 4.42 3.87 - 5.11 MIL/uL   Hemoglobin 11.6 (L) 12.0 - 15.0 g/dL   HCT 16.1 (L) 09.6 - 04.5 %   MCV 80.1 78.0 - 100.0 fL   MCH 26.2 26.0 - 34.0 pg   MCHC 32.8 30.0 - 36.0 g/dL   RDW 40.9 81.1 - 91.4 %   Platelets 239 150 - 400 K/uL    MAU Course  Procedures None  MDM CBC and quant hCG today Patient offered pain medication. Declines at this time.  0800 - Labs pending. Care turned over to Wynelle Bourgeois, CNM  Marny Lowenstein, PA-C  07/23/2015, 8:00 AM  Assessment and Plan   Results for orders placed or performed during the hospital encounter of 07/23/15 (from the past 24 hour(s))  Urinalysis, Routine w reflex microscopic (not at [facility])     Status: Abnormal   Collection Time: 07/23/15  6:38 AM  Result Value Ref Range   Color, Urine YELLOW YELLOW   APPearance CLEAR CLEAR   Specific Gravity, Urine 1.025 1.005 - 1.030   pH 5.5 5.0 - 8.0   Glucose, UA NEGATIVE NEGATIVE mg/dL   Hgb urine dipstick LARGE (A) NEGATIVE   Bilirubin Urine NEGATIVE NEGATIVE   Ketones, ur NEGATIVE NEGATIVE mg/dL   Protein, ur NEGATIVE NEGATIVE mg/dL   Nitrite NEGATIVE NEGATIVE   Leukocytes, UA NEGATIVE NEGATIVE  Urine microscopic-add on     Status: Abnormal   Collection Time: 07/23/15  6:38 AM  Result Value Ref Range   Squamous Epithelial / LPF 0-5 (A) NONE SEEN   WBC, UA  NONE SEEN 0 - 5 WBC/hpf   RBC / HPF 6-30 0 - 5 RBC/hpf   Bacteria, UA NONE SEEN NONE SEEN  hCG,  quantitative, pregnancy     Status: Abnormal   Collection Time: 07/23/15  6:57 AM  Result Value Ref Range   hCG, Beta Chain, Quant, S 8983 (H) <5 mIU/mL  CBC     Status: Abnormal   Collection Time: 07/23/15  6:57 AM  Result Value Ref Range   WBC 5.8 4.0 - 10.5 K/uL   RBC 4.42 3.87 - 5.11 MIL/uL   Hemoglobin 11.6 (L) 12.0 - 15.0 g/dL   HCT 16.1 (L) 09.6 - 04.5 %   MCV 80.1 78.0 - 100.0 fL   MCH 26.2 26.0 - 34.0 pg   MCHC 32.8 30.0 - 36.0 g/dL   RDW 40.9 81.1 - 91.4 %   Platelets 239 150 - 400 K/uL    Ref. Range 07/21/2015 14:59  HCG, Beta Chain, Quant, S Latest Ref Range: <5 mIU/mL 11438 (H)   US Ob Comp Less 14 Wks  07/21/2015  CLINICAL DATA:  First trimester pregnancy, vaginal bleeding LMP 05/03/15. EXAM: OBSTETRIC <14 WK [place] AND TRANSVAGINAL OB US TECHNIQUE: Both transabdominal and transvaginal ultrasound examinations were performed for complete evaluation of the gestation as well as the maternal uterus, adnexal regions, and pelvic cul-de-sac. Transvaginal technique was performed to assess early pregnancy. COMPARISON:  None. FINDINGS: Intrauterine gestational sac: Gestational sac is visualized. It appears mildly irregular in contour however. Yolk sac:  Not seen Embryo:  Not seen Cardiac Activity: Not seen Mean sac diameter of 1.3 cm corresponds to 6 weeks 1 day gestational age with estimated date of confinement 03/14/2016. Probable corpus luteum left ovary. Right ovary normal. Small volume free fluid in the pelvis. Endometrium is markedly thickened and heterogeneous with diameter of about 15 - 19mm. IMPRESSION: Heterogenous endometrium, with irregular gestational sac without yolk sac or embryo, and LMP 05/03/15. Findings are suspicious but not yet definitive for failed pregnancy. Recommend follow-up US in 10-14 days for definitive diagnosis. This recommendation follows SRU consensus guidelines:  Diagnostic Criteria for Nonviable Pregnancy Early in the First Trimester. Malva Limes Med 2013; 782:9562-13. Electronically Signed   By: Esperanza Heir M.D.   On: 07/21/2015 16:48   US Ob Transvaginal  07/21/2015  CLINICAL DATA:  First trimester pregnancy, vaginal bleeding LMP 05/03/15. EXAM: OBSTETRIC <14 WK [place] AND TRANSVAGINAL OB US TECHNIQUE: Both transabdominal and transvaginal ultrasound examinations were performed for complete evaluation of the gestation as well as the maternal uterus, adnexal regions, and pelvic cul-de-sac. Transvaginal technique was performed to assess early pregnancy. COMPARISON:  None. FINDINGS: Intrauterine gestational sac: Gestational sac is visualized. It appears mildly irregular in contour however. Yolk sac:  Not seen Embryo:  Not seen Cardiac Activity: Not seen Mean sac diameter of 1.3 cm corresponds to 6 weeks 1 day gestational age with estimated date of confinement 03/14/2016. Probable corpus luteum left ovary. Right ovary normal. Small volume free fluid in the pelvis. Endometrium is markedly thickened and heterogeneous with diameter of about 15 - 19mm. IMPRESSION: Heterogenous endometrium, with irregular gestational sac without yolk sac or embryo, and LMP 05/03/15. Findings are suspicious but not yet definitive for failed pregnancy. Recommend follow-up US in 10-14 days for definitive diagnosis. This recommendation follows SRU consensus guidelines: Diagnostic Criteria for Nonviable Pregnancy Early in the First Trimester. Malva Limes Med 2013; 086:5784-69. Electronically Signed   By: Esperanza Heir M.D.   On: 07/21/2015 16:48   A:  Missed abortion, quant dropped from 11,438 to 8983  P:  Discussed with Dr Lenard Forth expectant management vs  Cytotec        She and her husband elect to wait for now.        Rx provided for ibuprofen and percocet for cramping       Discussed bleeding and passage of clots when SAB happens as well as cramping       Will repeat Quant HCG in  clinic in a week. If she doesn't pass anything, will re-offer Cytotec. (07/31/15 BETWEEN 8-11AM)       SAB / bleeding precautions

## 2015-07-24 NOTE — MAU Provider Note (Signed)
Chief Complaint: Vaginal Bleeding   None     SUBJECTIVE HPI: Jacqueline Bowen is a [age] y.o. G2P1001 at 433w4d by unsure LMP who presents to maternity admissions reporting onset of light vaginal bleeding today.  She denies abdominal pain, vaginal itching/burning, urinary symptoms, h/a, dizziness, n/v, or fever/chills.     Vaginal Bleeding The patient's primary symptoms include vaginal bleeding. The patient's pertinent negatives include no pelvic pain or vaginal discharge. This is a new problem. The current episode started today. The problem occurs constantly. The problem has been unchanged. The patient is experiencing no pain. Pertinent negatives include no abdominal pain, back pain, chills, constipation, diarrhea, dysuria, fever, flank pain, frequency, headaches, nausea, urgency or vomiting. The vaginal bleeding is spotting. She has not been passing clots. She has not been passing tissue. She has tried nothing for the symptoms. She is sexually active. No, her partner does not have an STD.    Past Medical History  Diagnosis Date  . Medical history non-contributory    Past Surgical History  Procedure Laterality Date  . No past surgeries    . Cesarean section N/A 08/11/2013    Procedure: CESAREAN SECTION;  Surgeon: Jeani HawkingMichelle L Grewal, MD;  Location: [facility];  Service: Obstetrics;  Laterality: N/A;   Social History   Social History  . Marital Status: Married    Spouse Name: N/A  . Number of Children: N/A  . Years of Education: N/A   Occupational History  . Not on file.   Social History Main Topics  . Smoking status: Never Smoker   . Smokeless tobacco: Never Used  . Alcohol Use: No  . Drug Use: No  . Sexual Activity: Not Currently     Comment: postpartum   Other Topics Concern  . Not on file   Social History Narrative   No current facility-administered medications on file prior to encounter.   Current Outpatient Prescriptions on File Prior to Encounter  Medication Sig  Dispense Refill  . Prenatal Vit-Fe Fumarate-FA (PRENATAL MULTIVITAMIN) TABS tablet Take 1 tablet by mouth daily at 12 noon.     No Known Allergies  ROS:  Review of Systems  Constitutional: Negative for fever, chills and fatigue.  HENT: Negative for sinus pressure.   Eyes: Negative for photophobia.  Respiratory: Negative for shortness of breath.   Cardiovascular: Negative for chest pain.  Gastrointestinal: Negative for nausea, vomiting, abdominal pain, diarrhea and constipation.  Genitourinary: Positive for vaginal bleeding. Negative for dysuria, urgency, frequency, flank pain, vaginal discharge, difficulty urinating, vaginal pain and pelvic pain.  Musculoskeletal: Negative for back pain and neck pain.  Neurological: Negative for dizziness, weakness and headaches.  Psychiatric/Behavioral: Negative.      I have reviewed patient's Past Medical Hx, Surgical Hx, Family Hx, Social Hx, medications and allergies.   Physical Exam  No data found. VS reviewed and wnl  Constitutional: Well-developed, well-nourished female in no acute distress.  Cardiovascular: normal rate Respiratory: normal effort GI: Abd soft, non-tender. Pos BS x 4 MS: Extremities nontender, no edema, normal ROM Neurologic: Alert and oriented x 4.  GU: Neg CVAT.  PELVIC EXAM: Cervix pink, visually closed, without lesion, scant pink discharge, vaginal walls and external genitalia normal Bimanual exam: Cervix 0/long/high, firm, anterior, neg CMT, uterus nontender, nonenlarged, adnexa without tenderness, enlargement, or mass   LAB RESULTS No results found for this or any previous visit (from the past 24 hour(s)). Results for orders placed or performed during the hospital encounter of 07/23/15 (from the past 168  hour(s))  Urinalysis, Routine w reflex microscopic (not at [facility])   Collection Time: 07/23/15  6:38 AM  Result Value Ref Range   Color, Urine YELLOW YELLOW   APPearance CLEAR CLEAR   Specific Gravity, Urine  1.025 1.005 - 1.030   pH 5.5 5.0 - 8.0   Glucose, UA NEGATIVE NEGATIVE mg/dL   Hgb urine dipstick LARGE (A) NEGATIVE   Bilirubin Urine NEGATIVE NEGATIVE   Ketones, ur NEGATIVE NEGATIVE mg/dL   Protein, ur NEGATIVE NEGATIVE mg/dL   Nitrite NEGATIVE NEGATIVE   Leukocytes, UA NEGATIVE NEGATIVE  Urine microscopic-add on   Collection Time: 07/23/15  6:38 AM  Result Value Ref Range   Squamous Epithelial / LPF 0-5 (A) NONE SEEN   WBC, UA NONE SEEN 0 - 5 WBC/hpf   RBC / HPF 6-30 0 - 5 RBC/hpf   Bacteria, UA NONE SEEN NONE SEEN  hCG, quantitative, pregnancy   Collection Time: 07/23/15  6:57 AM  Result Value Ref Range   hCG, Beta Chain, Quant, S 8983 (H) <5 mIU/mL  CBC   Collection Time: 07/23/15  6:57 AM  Result Value Ref Range   WBC 5.8 4.0 - 10.5 K/uL   RBC 4.42 3.87 - 5.11 MIL/uL   Hemoglobin 11.6 (L) 12.0 - 15.0 g/dL   HCT 16.1 (L) 09.6 - 04.5 %   MCV 80.1 78.0 - 100.0 fL   MCH 26.2 26.0 - 34.0 pg   MCHC 32.8 30.0 - 36.0 g/dL   RDW 40.9 81.1 - 91.4 %   Platelets 239 150 - 400 K/uL  Results for orders placed or performed during the hospital encounter of 07/21/15 (from the past 168 hour(s))  Urinalysis, Routine w reflex microscopic (not at [facility])   Collection Time: 07/21/15  2:35 PM  Result Value Ref Range   Color, Urine YELLOW YELLOW   APPearance CLEAR CLEAR   Specific Gravity, Urine 1.025 1.005 - 1.030   pH 6.0 5.0 - 8.0   Glucose, UA NEGATIVE NEGATIVE mg/dL   Hgb urine dipstick LARGE (A) NEGATIVE   Bilirubin Urine NEGATIVE NEGATIVE   Ketones, ur NEGATIVE NEGATIVE mg/dL   Protein, ur NEGATIVE NEGATIVE mg/dL   Nitrite NEGATIVE NEGATIVE   Leukocytes, UA NEGATIVE NEGATIVE  Urine microscopic-add on   Collection Time: 07/21/15  2:35 PM  Result Value Ref Range   Squamous Epithelial / LPF 0-5 (A) NONE SEEN   WBC, UA NONE SEEN 0 - 5 WBC/hpf   RBC / HPF 6-30 0 - 5 RBC/hpf   Bacteria, UA FEW (A) NONE SEEN  Pregnancy, urine POC   Collection Time: 07/21/15  2:42 PM  Result Value  Ref Range   Preg Test, Ur POSITIVE (A) NEGATIVE  hCG, quantitative, pregnancy   Collection Time: 07/21/15  2:59 PM  Result Value Ref Range   hCG, Beta Chain, Quant, S 11438 (H) <5 mIU/mL  CBC   Collection Time: 07/21/15  3:03 PM  Result Value Ref Range   WBC 4.0 4.0 - 10.5 K/uL   RBC 4.34 3.87 - 5.11 MIL/uL   Hemoglobin 11.4 (L) 12.0 - 15.0 g/dL   HCT 78.2 (L) 95.6 - 21.3 %   MCV 80.0 78.0 - 100.0 fL   MCH 26.3 26.0 - 34.0 pg   MCHC 32.9 30.0 - 36.0 g/dL   RDW 08.6 57.8 - 46.9 %   Platelets 232 150 - 400 K/uL  HIV antibody (routine testing) (NOT for [facility])   Collection Time: 07/21/15  3:03 PM  Result Value Ref  Range   HIV Screen 4th Generation wRfx Non Reactive Non Reactive       IMAGING US Ob Comp Less 14 Wks  07/21/2015  CLINICAL DATA:  First trimester pregnancy, vaginal bleeding LMP 05/03/15. EXAM: OBSTETRIC <14 WK [place] AND TRANSVAGINAL OB US TECHNIQUE: Both transabdominal and transvaginal ultrasound examinations were performed for complete evaluation of the gestation as well as the maternal uterus, adnexal regions, and pelvic cul-de-sac. Transvaginal technique was performed to assess early pregnancy. COMPARISON:  None. FINDINGS: Intrauterine gestational sac: Gestational sac is visualized. It appears mildly irregular in contour however. Yolk sac:  Not seen Embryo:  Not seen Cardiac Activity: Not seen Mean sac diameter of 1.3 cm corresponds to 6 weeks 1 day gestational age with estimated date of confinement 03/14/2016. Probable corpus luteum left ovary. Right ovary normal. Small volume free fluid in the pelvis. Endometrium is markedly thickened and heterogeneous with diameter of about 15 - 19mm. IMPRESSION: Heterogenous endometrium, with irregular gestational sac without yolk sac or embryo, and LMP 05/03/15. Findings are suspicious but not yet definitive for failed pregnancy. Recommend follow-up US in 10-14 days for definitive diagnosis. This recommendation follows SRU consensus guidelines:  Diagnostic Criteria for Nonviable Pregnancy Early in the First Trimester. Malva Limes Med 2013; 191:4782-95. Electronically Signed   By: Esperanza Heir M.D.   On: 07/21/2015 16:48   US Ob Transvaginal  07/21/2015  CLINICAL DATA:  First trimester pregnancy, vaginal bleeding LMP 05/03/15. EXAM: OBSTETRIC <14 WK [place] AND TRANSVAGINAL OB US TECHNIQUE: Both transabdominal and transvaginal ultrasound examinations were performed for complete evaluation of the gestation as well as the maternal uterus, adnexal regions, and pelvic cul-de-sac. Transvaginal technique was performed to assess early pregnancy. COMPARISON:  None. FINDINGS: Intrauterine gestational sac: Gestational sac is visualized. It appears mildly irregular in contour however. Yolk sac:  Not seen Embryo:  Not seen Cardiac Activity: Not seen Mean sac diameter of 1.3 cm corresponds to 6 weeks 1 day gestational age with estimated date of confinement 03/14/2016. Probable corpus luteum left ovary. Right ovary normal. Small volume free fluid in the pelvis. Endometrium is markedly thickened and heterogeneous with diameter of about 15 - 19mm. IMPRESSION: Heterogenous endometrium, with irregular gestational sac without yolk sac or embryo, and LMP 05/03/15. Findings are suspicious but not yet definitive for failed pregnancy. Recommend follow-up US in 10-14 days for definitive diagnosis. This recommendation follows SRU consensus guidelines: Diagnostic Criteria for Nonviable Pregnancy Early in the First Trimester. Malva Limes Med 2013; 621:3086-57. Electronically Signed   By: Esperanza Heir M.D.   On: 07/21/2015 16:48    MAU Management/MDM: Ordered labs and reviewed results.  Findings today could represent a normal early pregnancy, spontaneous abortion or ectopic pregnancy which can be life-threatening.  Ectopic precautions were given to the patient with plan to return in 48 hours for repeat quant hcg to evaluate pregnancy development.  Discussed with pt poor prognosis of  irregular gestational sac. Answered pt questions.  Pt stable at time of discharge.  ASSESSMENT 1. Threatened miscarriage in early pregnancy   2. Vaginal bleeding in pregnancy, first trimester   3. Pregnancy of unknown anatomic location     PLAN Discharge home with ectopic and miscarriage precautions Return to MAU in 48 hours or sooner as needed for emergencies    Medication List    TAKE these medications        prenatal multivitamin Tabs tablet  Take 1 tablet by mouth daily at 12 noon.  Follow-up Information    Follow up with [facility].   Why:  In 48 hours for repeat labs or sooner as needed   Contact information:   [address] [id number] mc [place] [address] [phone]      Sharen Counter Certified Nurse-Midwife 07/24/2015  1:13 PM

## 2015-07-31 ENCOUNTER — Other Ambulatory Visit: Payer: Medicaid Other

## 2015-07-31 DIAGNOSIS — O039 Complete or unspecified spontaneous abortion without complication: Secondary | ICD-10-CM

## 2015-07-31 LAB — HCG, SERUM, QUALITATIVE: PREG SERUM: POSITIVE — AB

## 2015-07-31 NOTE — Progress Notes (Unsigned)
Patient here today for stat bhcg. Patient reports bleeding like a period with clots on 12/1. Patient denies bleeding or pain since then. Patient aware we will call with results. Patient had no questions

## 2015-08-01 ENCOUNTER — Telehealth: Payer: Self-pay | Admitting: General Practice

## 2015-08-01 LAB — HCG, QUANTITATIVE, PREGNANCY: HCG, BETA CHAIN, QUANT, S: 202 m[IU]/mL — AB (ref <5)

## 2015-08-01 NOTE — Telephone Encounter (Signed)
Per Jacqueline Bowen, patient needs repeat bhcg next week. Called patient and informed her of results & recommendations. Patient verbalized understanding and states she can come next Monday 12/12 @ 930. Patient had no questions

## 2015-08-07 ENCOUNTER — Other Ambulatory Visit: Payer: Medicaid Other

## 2015-08-07 DIAGNOSIS — O200 Threatened abortion: Secondary | ICD-10-CM

## 2015-08-08 LAB — HCG, QUANTITATIVE, PREGNANCY: HCG, BETA CHAIN, QUANT, S: 31.2 m[IU]/mL — AB

## 2015-08-14 ENCOUNTER — Telehealth: Payer: Self-pay

## 2015-08-14 NOTE — Telephone Encounter (Signed)
Patient called requesting HCG quant levels, I gave her results per Dr.Pratt normal does not need to come back they went down significantly. Patient understood.

## 2015-08-27 NOTE — L&D Delivery Note (Signed)
Delivery Note At 8:14 PM a viable female was delivered via Spontaneous VBAC Delivery (Presentation: vertex; OA).  APGAR: 5, 8; weight pending.   Placenta status: spontaneous, intact.  Cord: 3 vessel with the following complications: none.  Cord pH: not obtained.  After delivery, Dr. Genevie AnnSchenk examined the lower uterine segment from the inside and no defect was appreciated.  Anesthesia: Epidural Episiotomy:  None Lacerations:  None Suture Repair: none Est. Blood Loss (mL):  150  Mom to postpartum.  Baby to Couplet care / Skin to Skin.  Jacqueline Bowen 06/05/2016, 8:25 PM  OB FELLOW DELIVERY ATTESTATION  I was gloved and present for the delivery in its entirety, and I agree with the above resident's note.    Ernestina PennaNicholas Mabrey Howland, MD 10:50 AM

## 2015-10-11 ENCOUNTER — Ambulatory Visit (INDEPENDENT_AMBULATORY_CARE_PROVIDER_SITE_OTHER): Payer: Medicaid Other | Admitting: *Deleted

## 2015-10-11 DIAGNOSIS — Z3200 Encounter for pregnancy test, result unknown: Secondary | ICD-10-CM

## 2015-10-11 DIAGNOSIS — Z3201 Encounter for pregnancy test, result positive: Secondary | ICD-10-CM | POA: Diagnosis present

## 2015-10-11 LAB — POCT PREGNANCY, URINE: PREG TEST UR: POSITIVE — AB

## 2015-10-11 NOTE — Progress Notes (Signed)
Pt in for pregnancy test, result is positive. Her certain lmp is 08/28/15 and she is currently [redacted]w[redacted]d. Offered pregnancy verification letter but patient declines at this time.

## 2015-11-22 ENCOUNTER — Other Ambulatory Visit (HOSPITAL_COMMUNITY)
Admission: RE | Admit: 2015-11-22 | Discharge: 2015-11-22 | Disposition: A | Discharge status: Home / Self Care | Payer: Medicaid Other | Source: Ambulatory Visit | Attending: Certified Nurse Midwife | Admitting: Certified Nurse Midwife

## 2015-11-22 ENCOUNTER — Encounter: Payer: Self-pay | Admitting: Certified Nurse Midwife

## 2015-11-22 ENCOUNTER — Ambulatory Visit (INDEPENDENT_AMBULATORY_CARE_PROVIDER_SITE_OTHER): Payer: Medicaid Other | Admitting: Certified Nurse Midwife

## 2015-11-22 VITALS — BP 118/79 | HR 82 | Temp 98.4°F | Ht 66.0 in | Wt 145.0 lb

## 2015-11-22 DIAGNOSIS — Z1389 Encounter for screening for other disorder: Secondary | ICD-10-CM

## 2015-11-22 DIAGNOSIS — Z1151 Encounter for screening for human papillomavirus (HPV): Secondary | ICD-10-CM | POA: Diagnosis not present

## 2015-11-22 DIAGNOSIS — O34219 Maternal care for unspecified type scar from previous cesarean delivery: Secondary | ICD-10-CM

## 2015-11-22 DIAGNOSIS — Z113 Encounter for screening for infections with a predominantly sexual mode of transmission: Secondary | ICD-10-CM | POA: Diagnosis not present

## 2015-11-22 DIAGNOSIS — Z124 Encounter for screening for malignant neoplasm of cervix: Secondary | ICD-10-CM

## 2015-11-22 DIAGNOSIS — Z3491 Encounter for supervision of normal pregnancy, unspecified, first trimester: Secondary | ICD-10-CM

## 2015-11-22 DIAGNOSIS — Z01419 Encounter for gynecological examination (general) (routine) without abnormal findings: Secondary | ICD-10-CM | POA: Insufficient documentation

## 2015-11-22 DIAGNOSIS — Z98891 History of uterine scar from previous surgery: Secondary | ICD-10-CM

## 2015-11-22 DIAGNOSIS — Z3481 Encounter for supervision of other normal pregnancy, first trimester: Secondary | ICD-10-CM | POA: Insufficient documentation

## 2015-11-22 LAB — POCT URINALYSIS DIP (DEVICE)
Bilirubin Urine: NEGATIVE
Glucose, UA: NEGATIVE mg/dL
Hgb urine dipstick: NEGATIVE
Ketones, ur: NEGATIVE mg/dL
Leukocytes, UA: NEGATIVE
Nitrite: NEGATIVE
Protein, ur: NEGATIVE mg/dL
Specific Gravity, Urine: 1.02 (ref 1.005–1.030)
Urobilinogen, UA: 0.2 mg/dL (ref 0.0–1.0)
pH: 7 (ref 5.0–8.0)

## 2015-11-22 NOTE — Progress Notes (Signed)
Subjective:  Jacqueline Bowen is a 30 y.o. G3P1011 at 6976w2d being seen today for ongoing prenatal care.  She is currently monitored for the following issues for this low-risk pregnancy and has History of cesarean section complicating pregnancy and Encounter for supervision of other normal pregnancy in first trimester on her problem list. Discussed VBAC, including risks and benefits, and [facility] Pt given consent form for reference at home, has not signed yet.   Contractions: Not present. Vag. Bleeding: Scant.   . Denies leaking of fluid.  Pt reports nausea and vomiting that has improved recently.   The following portions of the patient's history were reviewed and updated as appropriate: allergies, current medications, past family history, past medical history, past social history, past surgical history and problem list. Problem list updated.  Objective:   Filed Vitals:   11/22/15 0908 11/22/15 0915  BP: 118/79   Pulse: 82   Temp: 98.4 F (36.9 C)   Height:  5\' 6"  (1.676 m)  Weight: 145 lb (65.772 kg)     Fetal Status: Fetal Heart Rate (bpm): 162         General:  Alert, oriented and cooperative. Patient is in no acute distress.  Skin: Skin is warm and dry. No rash noted.   Cardiovascular: RRR, no m/r/g  Respiratory: Normal respiratory effort, no problems with respiration noted, CTAB  Abdomen: Soft, gravid, appropriate for gestational age. Pain/Pressure: Absent     Pelvic: Vag. Bleeding: Scant Vag D/C Character: Yellow   Cervical exam performed        Extremities: Normal range of motion.  Edema: None  Mental Status: Normal mood and affect. Normal behavior. Normal judgment and thought content.   Urinalysis: Urine Protein: Negative Urine Glucose: Negative  Assessment and Plan:  Pregnancy: G3P1011 at 6976w2d  1. Encounter for routine screening for malformation using ultrasonics 2. Supervision of low-risk pregnancy, first trimester - POCT urinalysis dip (device) -  Prenatal Profile - Hemoglobinopathy evaluation - Culture, OB Urine - Prescript Monitor Profile(19) - Cytology - PAP - GC/Chlamydia probe amp ([place] - US MFM OB COMP + 14 WK; Future 3. S/P cesarean section 4. History of cesarean section complicating pregnancy  Preterm labor symptoms and general obstetric precautions including but not limited to vaginal bleeding, contractions, leaking of fluid and fetal movement were reviewed in detail with the patient. Please refer to After Visit Summary for other counseling recommendations.  Return in about 4 weeks (around 12/20/2015).   Thomasena EdisJohn N Meghan Warshawsky, Med Student

## 2015-11-22 NOTE — Patient Instructions (Signed)
Safe Medications in Pregnancy   Acne: Benzoyl Peroxide Salicylic Acid  Backache/Headache: Tylenol: 2 regular strength every 4 hours OR              2 Extra strength every 6 hours  Colds/Coughs/Allergies: Benadryl (alcohol free) 25 mg every 6 hours as needed Breath right strips Claritin Cepacol throat lozenges Chloraseptic throat spray Cold-Eeze- up to three times per day Cough drops, alcohol free Flonase (by prescription only) Guaifenesin Mucinex Robitussin DM (plain only, alcohol free) Saline nasal spray/drops Sudafed (pseudoephedrine) & Actifed ** use only after [redacted] weeks gestation and if you do not have high blood pressure Tylenol Vicks Vaporub Zinc lozenges Zyrtec   Constipation: Colace Ducolax suppositories Fleet enema Glycerin suppositories Metamucil Milk of magnesia Miralax Senokot Smooth move tea  Diarrhea: Kaopectate Imodium A-D  *NO pepto Bismol  Hemorrhoids: Anusol Anusol HC Preparation H Tucks  Indigestion: Tums Maalox Mylanta Zantac  Pepcid  Insomnia: Benadryl (alcohol free)  every 6 hours as needed Tylenol PM Unisom, no Gelcaps  Leg Cramps: Tums MagGel  Nausea/Vomiting:  Bonine Dramamine Emetrol Ginger extract Sea bands Meclizine  Nausea medication to take during pregnancy:  Unisom (doxylamine succinate 25 mg tablets) Take one tablet daily at bedtime. If symptoms are not adequately controlled, the dose can be increased to a maximum recommended dose of two tablets daily (1/2 tablet in the morning, 1/2 tablet mid-afternoon and one at bedtime). Vitamin B6  tablets. Take one tablet twice a day (up to 200 mg per day).  Skin Rashes: Aveeno products Benadryl cream or  every 6 hours as needed Calamine Lotion 1% cortisone cream  Yeast infection: Gyne-lotrimin 7 Monistat 7   **If taking multiple medications, please check labels to avoid duplicating the same active ingredients **take medication as directed on  the label ** Do not exceed 4000 mg of tylenol in 24 hours **Do not take medications that contain aspirin or ibuprofen   Second Trimester of Pregnancy The second trimester is from week 13 through week 28, month 4 through 6. This is often the time in pregnancy that you feel your best. Often times, morning sickness has lessened or quit. You may have more energy, and you may get hungry more often. Your unborn baby (fetus) is growing rapidly. At the end of the sixth month, he or she is about 9 inches long and weighs about 1 pounds. You will likely feel the baby move (quickening) between 18 and 20 weeks of pregnancy. HOME CARE   Avoid all smoking, herbs, and alcohol. Avoid drugs not approved by your doctor.  Do not use any tobacco products, including cigarettes, chewing tobacco, and electronic cigarettes. If you need help quitting, ask your doctor. You may get counseling or other support to help you quit.  Only take medicine as told by your doctor. Some medicines are safe and some are not during pregnancy.  Exercise only as told by your doctor. Stop exercising if you start having cramps.  Eat regular, healthy meals.  Wear a good support bra if your breasts are tender.  Do not use hot tubs, steam rooms, or saunas.  Wear your seat belt when driving.  Avoid raw meat, uncooked cheese, and liter boxes and soil used by cats.  Take your prenatal vitamins.  Take 1500-2000 milligrams of calcium daily starting at the 20th week of pregnancy until you deliver your baby.  Try taking medicine that helps you poop (stool softener) as needed, and if your doctor approves. Eat more fiber by eating fresh  fruit, vegetables, and whole grains. Drink enough fluids to keep your pee (urine) clear or pale yellow.  Take warm water baths (sitz baths) to soothe pain or discomfort caused by hemorrhoids. Use hemorrhoid cream if your doctor approves.  If you have puffy, bulging veins (varicose veins), wear support  hose. Raise (elevate) your feet for 15 minutes, 3-4 times a day. Limit salt in your diet.  Avoid heavy lifting, wear low heals, and sit up straight.  Rest with your legs raised if you have leg cramps or low back pain.  Visit your dentist if you have not gone during your pregnancy. Use a soft toothbrush to brush your teeth. Be gentle when you floss.  You can have sex (intercourse) unless your doctor tells you not to.  Go to your doctor visits. GET HELP IF:   You feel dizzy.  You have mild cramps or pressure in your lower belly (abdomen).  You have a nagging pain in your belly area.  You continue to feel sick to your stomach (nauseous), throw up (vomit), or have watery poop (diarrhea).  You have bad smelling fluid coming from your vagina.  You have pain with peeing (urination). GET HELP RIGHT AWAY IF:   You have a fever.  You are leaking fluid from your vagina.  You have spotting or bleeding from your vagina.  You have severe belly cramping or pain.  You lose or gain weight rapidly.  You have trouble catching your breath and have chest pain.  You notice sudden or extreme puffiness (swelling) of your face, hands, ankles, feet, or legs.  You have not felt the baby move in over an hour.  You have severe headaches that do not go away with medicine.  You have vision changes.   This information is not intended to replace advice given to you by your health care provider. Make sure you discuss any questions you have with your health care provider.   Document Released: 11/06/2009 Document Revised: 09/02/2014 Document Reviewed: 10/13/2012 Elsevier Interactive Patient Education [employer]2016 Elsevier Inc.

## 2015-11-22 NOTE — Progress Notes (Signed)
   Subjective:    Jacqueline Bowen is a G3P1011 524w2d being seen today for her first obstetrical visit.  Her obstetrical history is significant for none. Patient does intend to breast feed. Pregnancy history fully reviewed.  Patient reports no complaints.  Filed Vitals:   11/22/15 0908 11/22/15 0915  BP: 118/79   Pulse: 82   Temp: 98.4 F (36.9 C)   Height:  5\' 6"  (1.676 m)  Weight: 145 lb (65.772 kg)     HISTORY: OB History  Gravida Para Term Preterm AB SAB TAB Ectopic Multiple Living  3 1 1  0 1 1 0 0 0 1    # Outcome Date GA Lbr Len/2nd Weight Sex Delivery Anes PTL Lv  3 Current           2 Term 08/11/13 866w5d  7 lb 7.2 oz (3.38 kg) F CS-LTranv Spinal  Y     Comments: states had c/s due to meconium after AROM  1 SAB              Past Medical History  Diagnosis Date  . Medical history non-contributory    Past Surgical History  Procedure Laterality Date  . Cesarean section N/A 08/11/2013    Procedure: CESAREAN SECTION;  Surgeon: Jeani HawkingMichelle L Grewal, MD;  Location: [facility];  Service: Obstetrics;  Laterality: N/A;   Family History  Problem Relation Age of Onset  . Heart attack Neg Hx      Exam    Uterus:     Pelvic Exam:    Perineum: No Hemorrhoids   Vulva: normal   Vagina:  normal mucosa   pH:    Cervix: no bleeding following Pap and no cervical motion tenderness   Adnexa: not evaluated   Bony Pelvis: gynecoid  System: Breast:  normal appearance, no masses or tenderness   Skin: normal coloration and turgor, no rashes    Neurologic: oriented, normal, normal mood   Extremities: normal strength, tone, and muscle mass   HEENT    Mouth/Teeth mucous membranes moist, pharynx normal without lesions   Neck supple and no masses   Cardiovascular: regular rate and rhythm   Respiratory:  appears well, vitals normal, no respiratory distress, acyanotic, normal RR, ear and throat exam is normal, neck free of mass or lymphadenopathy, chest clear, no wheezing,  crepitations, rhonchi, normal symmetric air entry   Abdomen: soft, non-tender; bowel sounds normal; no masses,  no organomegaly   Urinary: urethral meatus normal      Assessment:    Pregnancy: [id number] Patient Active Problem List   Diagnosis Date Noted  . History of cesarean section complicating pregnancy 11/22/2015  . Encounter for supervision of other normal pregnancy in first trimester 11/22/2015        Plan:     Initial labs drawn. Prenatal vitamins. Problem list reviewed and updated. Genetic Screening discussed First Screen and Quad Screen: declined.  Ultrasound discussed; fetal survey: ordered.  Follow up in 4 weeks. 50% of 30 min visit spent on counseling and coordination of care.     Clemmons,Lori Grissett 11/22/2015

## 2015-11-22 NOTE — Progress Notes (Signed)
New OB & 28 week packet given Patient declined first screen

## 2015-11-23 LAB — PRENATAL PROFILE (SOLSTAS)
Antibody Screen: NEGATIVE
Basophils Absolute: 0 10*3/uL (ref 0.0–0.1)
Basophils Relative: 0 % (ref 0–1)
Eosinophils Absolute: 0 10*3/uL (ref 0.0–0.7)
Eosinophils Relative: 0 % (ref 0–5)
HCT: 34.4 % — ABNORMAL LOW (ref 36.0–46.0)
HIV 1&2 Ab, 4th Generation: NONREACTIVE
Hemoglobin: 11.7 g/dL — ABNORMAL LOW (ref 12.0–15.0)
Hepatitis B Surface Ag: NEGATIVE
Lymphocytes Relative: 19 % (ref 12–46)
Lymphs Abs: 0.9 10*3/uL (ref 0.7–4.0)
MCH: 26.2 pg (ref 26.0–34.0)
MCHC: 34 g/dL (ref 30.0–36.0)
MCV: 77.1 fL — ABNORMAL LOW (ref 78.0–100.0)
MPV: 8.9 fL (ref 8.6–12.4)
Monocytes Absolute: 0.3 10*3/uL (ref 0.1–1.0)
Monocytes Relative: 7 % (ref 3–12)
Neutro Abs: 3.3 10*3/uL (ref 1.7–7.7)
Neutrophils Relative %: 74 % (ref 43–77)
Platelets: 261 10*3/uL (ref 150–400)
RBC: 4.46 MIL/uL (ref 3.87–5.11)
RDW: 13.5 % (ref 11.5–15.5)
Rh Type: POSITIVE
Rubella: 5.21 Index — ABNORMAL HIGH (ref <0.90)
WBC: 4.5 10*3/uL (ref 4.0–10.5)

## 2015-11-23 LAB — GC/CHLAMYDIA PROBE AMP (~~LOC~~) NOT AT ARMC
Chlamydia: NEGATIVE
Neisseria Gonorrhea: NEGATIVE

## 2015-11-24 LAB — HEMOGLOBINOPATHY EVALUATION
Hemoglobin Other: 0 %
Hgb A2 Quant: 2.9 % (ref 2.2–3.2)
Hgb A: 97.1 % (ref 96.8–97.8)
Hgb F Quant: 0 % (ref 0.0–2.0)
Hgb S Quant: 0 %

## 2015-11-24 LAB — PRESCRIPTION MONITORING PROFILE (19 PANEL)
Amphetamine/Meth: NEGATIVE ng/mL
Barbiturate Screen, Urine: NEGATIVE ng/mL
Benzodiazepine Screen, Urine: NEGATIVE ng/mL
Buprenorphine, Urine: NEGATIVE ng/mL
Cannabinoid Scrn, Ur: NEGATIVE ng/mL
Carisoprodol, Urine: NEGATIVE ng/mL
Cocaine Metabolites: NEGATIVE ng/mL
Creatinine, Urine: 126.35 mg/dL (ref >20.0)
Fentanyl, Ur: NEGATIVE ng/mL
MDMA URINE: NEGATIVE ng/mL
Meperidine, Ur: NEGATIVE ng/mL
Methadone Screen, Urine: NEGATIVE ng/mL
Methaqualone: NEGATIVE ng/mL
Nitrites, Initial: NEGATIVE ug/mL
Opiate Screen, Urine: NEGATIVE ng/mL
Oxycodone Screen, Ur: NEGATIVE ng/mL
Phencyclidine, Ur: NEGATIVE ng/mL
Propoxyphene: NEGATIVE ng/mL
Tapentadol, urine: NEGATIVE ng/mL
Tramadol Scrn, Ur: NEGATIVE ng/mL
Zolpidem, Urine: NEGATIVE ng/mL
pH, Initial: 7.5 pH (ref 4.5–8.9)

## 2015-11-24 LAB — CULTURE, OB URINE
Colony Count: NO GROWTH
Organism ID, Bacteria: NO GROWTH

## 2015-11-24 LAB — CYTOLOGY - PAP

## 2015-12-20 ENCOUNTER — Ambulatory Visit (INDEPENDENT_AMBULATORY_CARE_PROVIDER_SITE_OTHER): Payer: Medicaid Other | Admitting: Family

## 2015-12-20 VITALS — BP 117/66 | HR 85 | Temp 97.9°F | Wt 150.0 lb

## 2015-12-20 DIAGNOSIS — Z3490 Encounter for supervision of normal pregnancy, unspecified, unspecified trimester: Secondary | ICD-10-CM | POA: Insufficient documentation

## 2015-12-20 DIAGNOSIS — Z3482 Encounter for supervision of other normal pregnancy, second trimester: Secondary | ICD-10-CM

## 2015-12-20 DIAGNOSIS — O34219 Maternal care for unspecified type scar from previous cesarean delivery: Secondary | ICD-10-CM

## 2015-12-20 LAB — POCT URINALYSIS DIP (DEVICE)
BILIRUBIN URINE: NEGATIVE
Glucose, UA: NEGATIVE mg/dL
HGB URINE DIPSTICK: NEGATIVE
Ketones, ur: NEGATIVE mg/dL
Leukocytes, UA: NEGATIVE
NITRITE: NEGATIVE
Protein, ur: NEGATIVE mg/dL
Specific Gravity, Urine: 1.015 (ref 1.005–1.030)
UROBILINOGEN UA: 0.2 mg/dL (ref 0.0–1.0)
pH: 6.5 (ref 5.0–8.0)

## 2015-12-20 NOTE — Progress Notes (Signed)
Pt has brought her VBAC form with her today.

## 2015-12-20 NOTE — Progress Notes (Signed)
Subjective:  Jacqueline Bowen is a 30 y.o. G3P1011 at 3857w2d being seen today for ongoing prenatal care.  She is currently monitored for the following issues for this low-risk pregnancy and has History of cesarean section complicating pregnancy and Supervision of normal pregnancy, antepartum on her problem list.  Patient reports no complaints.  Contractions: Not present. Vag. Bleeding: None.  Movement: Present. Denies leaking of fluid.   The following portions of the patient's history were reviewed and updated as appropriate: allergies, current medications, past family history, past medical history, past social history, past surgical history and problem list. Problem list updated.  Objective:   Filed Vitals:   12/20/15 0826  BP: 117/66  Pulse: 85  Temp: 97.9 F (36.6 C)  Weight: 150 lb (68.04 kg)    Fetal Status: Fetal Heart Rate (bpm): 148 Fundal Height: 17 cm Movement: Present     General:  Alert, oriented and cooperative. Patient is in no acute distress.  Skin: Skin is warm and dry. No rash noted.   Cardiovascular: Normal heart rate noted  Respiratory: Normal respiratory effort, no problems with respiration noted  Abdomen: Soft, gravid, appropriate for gestational age. Pain/Pressure: Absent     Pelvic: Vag. Bleeding: None     Cervical exam deferred        Extremities: Normal range of motion.  Edema: None  Mental Status: Normal mood and affect. Normal behavior. Normal judgment and thought content.   Urinalysis: Urine Protein: Negative Urine Glucose: Negative  Assessment and Plan:  Pregnancy: G3P1011 at 5157w2d  1. History of cesarean section complicating pregnancy - Consent for TOLAC obtained today  2. Supervision of normal pregnancy, antepartum, second trimester - Anatomy ultrasound previously scheduled  General obstetric precautions including but not limited to vaginal bleeding and pelvic pain reviewed in detail with the patient. Return in about 4 weeks (around  01/17/2016).   Eino FarberWalidah Kennith GainN Karim, CNM

## 2015-12-22 ENCOUNTER — Encounter: Payer: Self-pay | Admitting: *Deleted

## 2016-01-10 ENCOUNTER — Other Ambulatory Visit: Payer: Self-pay | Admitting: General Practice

## 2016-01-10 ENCOUNTER — Ambulatory Visit (HOSPITAL_COMMUNITY)
Admission: RE | Admit: 2016-01-10 | Discharge: 2016-01-10 | Disposition: A | Discharge status: Home / Self Care | Payer: Medicaid Other | Source: Ambulatory Visit | Attending: Certified Nurse Midwife | Admitting: Certified Nurse Midwife

## 2016-01-10 DIAGNOSIS — Z3A19 19 weeks gestation of pregnancy: Secondary | ICD-10-CM | POA: Diagnosis not present

## 2016-01-10 DIAGNOSIS — Z3491 Encounter for supervision of normal pregnancy, unspecified, first trimester: Secondary | ICD-10-CM

## 2016-01-10 DIAGNOSIS — Z1389 Encounter for screening for other disorder: Secondary | ICD-10-CM

## 2016-01-10 DIAGNOSIS — Z36 Encounter for antenatal screening of mother: Secondary | ICD-10-CM | POA: Insufficient documentation

## 2016-01-10 DIAGNOSIS — Z98891 History of uterine scar from previous surgery: Secondary | ICD-10-CM

## 2016-01-10 DIAGNOSIS — O34219 Maternal care for unspecified type scar from previous cesarean delivery: Secondary | ICD-10-CM | POA: Diagnosis not present

## 2016-01-17 ENCOUNTER — Encounter: Payer: Medicaid Other | Admitting: Family

## 2016-01-31 ENCOUNTER — Encounter: Payer: Self-pay | Admitting: Advanced Practice Midwife

## 2016-01-31 ENCOUNTER — Ambulatory Visit (INDEPENDENT_AMBULATORY_CARE_PROVIDER_SITE_OTHER): Payer: Medicaid Other | Admitting: Advanced Practice Midwife

## 2016-01-31 VITALS — BP 110/74 | HR 97 | Temp 98.3°F | Wt 158.3 lb

## 2016-01-31 DIAGNOSIS — Z3482 Encounter for supervision of other normal pregnancy, second trimester: Secondary | ICD-10-CM

## 2016-01-31 LAB — POCT URINALYSIS DIP (DEVICE)
Bilirubin Urine: NEGATIVE
GLUCOSE, UA: NEGATIVE mg/dL
Hgb urine dipstick: NEGATIVE
Ketones, ur: NEGATIVE mg/dL
LEUKOCYTES UA: NEGATIVE
NITRITE: NEGATIVE
PROTEIN: NEGATIVE mg/dL
SPECIFIC GRAVITY, URINE: 1.02 (ref 1.005–1.030)
UROBILINOGEN UA: 0.2 mg/dL (ref 0.0–1.0)
pH: 7 (ref 5.0–8.0)

## 2016-01-31 NOTE — Patient Instructions (Signed)
Tdap Vaccine (Tetanus, Diphtheria and Pertussis): What You Need to Know 1. Why get vaccinated? Tetanus, diphtheria and pertussis are very serious diseases. Tdap vaccine can protect us from these diseases. And, Tdap vaccine given to pregnant women can protect newborn babies against pertussis. TETANUS (Lockjaw) is rare in the [place] today. It causes painful muscle tightening and stiffness, usually all over the body.  It can lead to tightening of muscles in the head and neck so you can't open your mouth, swallow, or sometimes even breathe. Tetanus kills about 1 out of 10 people who are infected even after receiving the best medical care. DIPHTHERIA is also rare in the [place] today. It can cause a thick coating to form in the back of the throat.  It can lead to breathing problems, heart failure, paralysis, and death. PERTUSSIS (Whooping Cough) causes severe coughing spells, which can cause difficulty breathing, vomiting and disturbed sleep.  It can also lead to weight loss, incontinence, and rib fractures. Up to 2 in 100 adolescents and 5 in 100 adults with pertussis are hospitalized or have complications, which could include pneumonia or death. These diseases are caused by bacteria. Diphtheria and pertussis are spread from person to person through secretions from coughing or sneezing. Tetanus enters the body through cuts, scratches, or wounds. Before vaccines, as many as 200,000 cases of diphtheria, 200,000 cases of pertussis, and hundreds of cases of tetanus, were reported in the [place] each year. Since vaccination began, reports of cases for tetanus and diphtheria have dropped by about 99% and for pertussis by about 80%. 2. Tdap vaccine Tdap vaccine can protect adolescents and adults from tetanus, diphtheria, and pertussis. One dose of Tdap is routinely given at age 11 or 12. People who did not get Tdap at that age should get it as soon as possible. Tdap is especially important  for healthcare professionals and anyone having close contact with a baby younger than 12 months. Pregnant women should get a dose of Tdap during every pregnancy, to protect the newborn from pertussis. Infants are most at risk for severe, life-threatening complications from pertussis. Another vaccine, called Td, protects against tetanus and diphtheria, but not pertussis. A Td booster should be given every 10 years. Tdap may be given as one of these boosters if you have never gotten Tdap before. Tdap may also be given after a severe cut or burn to prevent tetanus infection. Your doctor or the person giving you the vaccine can give you more information. Tdap may safely be given at the same time as other vaccines. 3. Some people should not get this vaccine  A person who has ever had a life-threatening allergic reaction after a previous dose of any diphtheria, tetanus or pertussis containing vaccine, OR has a severe allergy to any part of this vaccine, should not get Tdap vaccine. Tell the person giving the vaccine about any severe allergies.  Anyone who had coma or long repeated seizures within 7 days after a childhood dose of DTP or DTaP, or a previous dose of Tdap, should not get Tdap, unless a cause other than the vaccine was found. They can still get Td.  Talk to your doctor if you:  have seizures or another nervous system problem,  had severe pain or swelling after any vaccine containing diphtheria, tetanus or pertussis,  ever had a condition called Guillain-Barr Syndrome (GBS),  aren't feeling well on the day the shot is scheduled. 4. Risks With any medicine, including vaccines, there is   a chance of side effects. These are usually mild and go away on their own. Serious reactions are also possible but are rare. Most people who get Tdap vaccine do not have any problems with it. Mild problems following Tdap (Did not interfere with activities)  Pain where the shot was given (about 3 in 4  adolescents or 2 in 3 adults)  Redness or swelling where the shot was given (about 1 person in 5)  Mild fever of at least 100.4F (up to about 1 in 25 adolescents or 1 in 100 adults)  Headache (about 3 or 4 people in 10)  Tiredness (about 1 person in 3 or 4)  Nausea, vomiting, diarrhea, stomach ache (up to 1 in 4 adolescents or 1 in 10 adults)  Chills, sore joints (about 1 person in 10)  Body aches (about 1 person in 3 or 4)  Rash, swollen glands (uncommon) Moderate problems following Tdap (Interfered with activities, but did not require medical attention)  Pain where the shot was given (up to 1 in 5 or 6)  Redness or swelling where the shot was given (up to about 1 in 16 adolescents or 1 in 12 adults)  Fever over 102F (about 1 in 100 adolescents or 1 in 250 adults)  Headache (about 1 in 7 adolescents or 1 in 10 adults)  Nausea, vomiting, diarrhea, stomach ache (up to 1 or 3 people in 100)  Swelling of the entire arm where the shot was given (up to about 1 in 500). Severe problems following Tdap (Unable to perform usual activities; required medical attention)  Swelling, severe pain, bleeding and redness in the arm where the shot was given (rare). Problems that could happen after any vaccine:  People sometimes faint after a medical procedure, including vaccination. Sitting or lying down for about 15 minutes can help prevent fainting, and injuries caused by a fall. Tell your doctor if you feel dizzy, or have vision changes or ringing in the ears.  Some people get severe pain in the shoulder and have difficulty moving the arm where a shot was given. This happens very rarely.  Any medication can cause a severe allergic reaction. Such reactions from a vaccine are very rare, estimated at fewer than 1 in a million doses, and would happen within a few minutes to a few hours after the vaccination. As with any medicine, there is a very remote chance of a vaccine causing a serious  injury or death. The safety of vaccines is always being monitored. For more information, visit: www.cdc.gov/vaccinesafety/ 5. What if there is a serious problem? What should I look for?  Look for anything that concerns you, such as signs of a severe allergic reaction, very high fever, or unusual behavior.  Signs of a severe allergic reaction can include hives, swelling of the face and throat, difficulty breathing, a fast heartbeat, dizziness, and weakness. These would usually start a few minutes to a few hours after the vaccination. What should I do?  If you think it is a severe allergic reaction or other emergency that can't wait, call 9-1-1 or get the person to the nearest hospital. Otherwise, call your doctor.  Afterward, the reaction should be reported to the Vaccine Adverse Event Reporting System (VAERS). Your doctor might file this report, or you can do it yourself through the VAERS web site at www.vaers.hhs.gov, or by calling 1-800-822-7967. VAERS does not give medical advice.  6. The National Vaccine Injury Compensation Program The National Vaccine Injury Compensation Program (  VICP) is a federal program that was created to compensate people who may have been injured by certain vaccines. Persons who believe they may have been injured by a vaccine can learn about the program and about filing a claim by calling 1-800-338-2382 or visiting the VICP website at www.hrsa.gov/vaccinecompensation. There is a time limit to file a claim for compensation. 7. How can I learn more?  Ask your doctor. He or she can give you the vaccine package insert or suggest other sources of information.  Call your local or state health department.  Contact the Centers for Disease Control and Prevention (CDC):  Call 1-800-232-4636 (1-800-CDC-INFO) or  Visit CDC's website at www.cdc.gov/vaccines CDC Tdap Vaccine VIS (10/19/13)   This information is not intended to replace advice given to you by your health care  provider. Make sure you discuss any questions you have with your health care provider.   Document Released: 02/11/2012 Document Revised: 09/02/2014 Document Reviewed: 11/24/2013 Elsevier Interactive Patient Education 2016 [employer]  

## 2016-01-31 NOTE — Progress Notes (Signed)
Pt filled out medicaid home form on this visit.

## 2016-01-31 NOTE — Progress Notes (Signed)
Subjective:  Jacqueline Bowen is a 30 y.o. G3P1011 at 1231w2d being seen today for ongoing prenatal care.  She is currently monitored for the following issues for this low-risk pregnancy and has History of cesarean section complicating pregnancy and Supervision of normal pregnancy, antepartum on her problem list.  Patient reports no complaints.  Contractions: Not present. Vag. Bleeding: None.  Movement: Present. Denies leaking of fluid.   The following portions of the patient's history were reviewed and updated as appropriate: allergies, current medications, past family history, past medical history, past social history, past surgical history and problem list. Problem list updated.  Objective:   Filed Vitals:   01/31/16 0834  BP: 110/74  Pulse: 97  Temp: 98.3 F (36.8 C)  Weight: 158 lb 4.8 oz (71.804 kg)    Fetal Status: Fetal Heart Rate (bpm): 145 Fundal Height: 22 cm Movement: Present     General:  Alert, oriented and cooperative. Patient is in no acute distress.  Skin: Skin is warm and dry. No rash noted.   Cardiovascular: Normal heart rate noted  Respiratory: Normal respiratory effort, no problems with respiration noted  Abdomen: Soft, gravid, appropriate for gestational age. Pain/Pressure: Present     Pelvic: Vag. Bleeding: None     Cervical exam deferred        Extremities: Normal range of motion.  Edema: None  Mental Status: Normal mood and affect. Normal behavior. Normal judgment and thought content.   Urinalysis: Urine Protein: Negative Urine Glucose: Negative  Assessment and Plan:  Pregnancy: G3P1011 at 931w2d  1. Supervision of normal pregnancy, antepartum, second trimester   Preterm labor symptoms and general obstetric precautions including but not limited to vaginal bleeding, contractions, leaking of fluid and fetal movement were reviewed in detail with the patient. Please refer to After Visit Summary for other counseling recommendations.  Return in about 4 weeks  (around 02/28/2016).   Dorathy KinsmanVirginia Altagracia Rone, CNM

## 2016-03-05 ENCOUNTER — Encounter: Payer: Medicaid Other | Admitting: Advanced Practice Midwife

## 2016-03-07 ENCOUNTER — Ambulatory Visit (INDEPENDENT_AMBULATORY_CARE_PROVIDER_SITE_OTHER): Payer: Medicaid Other | Admitting: Advanced Practice Midwife

## 2016-03-07 VITALS — BP 126/68 | HR 96 | Wt 166.3 lb

## 2016-03-07 DIAGNOSIS — Z3492 Encounter for supervision of normal pregnancy, unspecified, second trimester: Secondary | ICD-10-CM

## 2016-03-07 DIAGNOSIS — Z3482 Encounter for supervision of other normal pregnancy, second trimester: Secondary | ICD-10-CM

## 2016-03-07 LAB — CBC
HEMATOCRIT: 31.6 % — AB (ref 35.0–45.0)
HEMOGLOBIN: 10.7 g/dL — AB (ref 11.7–15.5)
MCH: 27.6 pg (ref 27.0–33.0)
MCHC: 33.9 g/dL (ref 32.0–36.0)
MCV: 81.4 fL (ref 80.0–100.0)
MPV: 9.3 fL (ref 7.5–12.5)
Platelets: 228 10*3/uL (ref 140–400)
RBC: 3.88 MIL/uL (ref 3.80–5.10)
RDW: 13.3 % (ref 11.0–15.0)
WBC: 9.5 10*3/uL (ref 3.8–10.8)

## 2016-03-07 NOTE — Progress Notes (Signed)
Subjective:  Jacqueline Bowen is a 30 y.o. G3P1011 at 3511w3d being seen today for ongoing prenatal care.  She is currently monitored for the following issues for this low-risk pregnancy and has History of cesarean section complicating pregnancy and Supervision of normal pregnancy, antepartum on her problem list.  Patient reports no complaints.  Contractions: Not present. Vag. Bleeding: None.  Movement: Present. Denies leaking of fluid.   The following portions of the patient's history were reviewed and updated as appropriate: allergies, current medications, past family history, past medical history, past social history, past surgical history and problem list. Problem list updated.  Objective:   Filed Vitals:   03/07/16 0819  BP: 126/68  Pulse: 96  Weight: 166 lb 4.8 oz (75.433 kg)    Fetal Status: Fetal Heart Rate (bpm): 148   Movement: Present     General:  Alert, oriented and cooperative. Patient is in no acute distress.  Skin: Skin is warm and dry. No rash noted.   Cardiovascular: Normal heart rate noted  Respiratory: Normal respiratory effort, no problems with respiration noted  Abdomen: Soft, gravid, appropriate for gestational age. Pain/Pressure: Absent     Pelvic:  Cervical exam deferred        Extremities: Normal range of motion.  Edema: None  Mental Status: Normal mood and affect. Normal behavior. Normal judgment and thought content.   Urinalysis:      Assessment and Plan:  Pregnancy: G3P1011 at 6111w3d  1. Supervision of normal pregnancy in second trimester  - Glucose Tolerance, 1 HR (50g) w/o Fasting - HIV antibody (with reflex) - RPR - CBC  2. Supervision of normal pregnancy, antepartum, second trimester   Preterm labor symptoms and general obstetric precautions including but not limited to vaginal bleeding, contractions, leaking of fluid and fetal movement were reviewed in detail with the patient. Please refer to After Visit Summary for other counseling  recommendations.  Return in about 2 weeks (around 03/21/2016).   Jacqueline Bowen, CNM

## 2016-03-07 NOTE — Progress Notes (Signed)
1hr gtt/28 week labs today Pt would like to receive tdap at next visit

## 2016-03-07 NOTE — Patient Instructions (Addendum)
Tdap Vaccine (Tetanus, Diphtheria and Pertussis): What You Need to Know 1. Why get vaccinated? Tetanus, diphtheria and pertussis are very serious diseases. Tdap vaccine can protect us from these diseases. And, Tdap vaccine given to pregnant women can protect newborn babies against pertussis. TETANUS (Lockjaw) is rare in the [place] today. It causes painful muscle tightening and stiffness, usually all over the body.  It can lead to tightening of muscles in the head and neck so you can't open your mouth, swallow, or sometimes even breathe. Tetanus kills about 1 out of 10 people who are infected even after receiving the best medical care. DIPHTHERIA is also rare in the [place] today. It can cause a thick coating to form in the back of the throat.  It can lead to breathing problems, heart failure, paralysis, and death. PERTUSSIS (Whooping Cough) causes severe coughing spells, which can cause difficulty breathing, vomiting and disturbed sleep.  It can also lead to weight loss, incontinence, and rib fractures. Up to 2 in 100 adolescents and 5 in 100 adults with pertussis are hospitalized or have complications, which could include pneumonia or death. These diseases are caused by bacteria. Diphtheria and pertussis are spread from person to person through secretions from coughing or sneezing. Tetanus enters the body through cuts, scratches, or wounds. Before vaccines, as many as 200,000 cases of diphtheria, 200,000 cases of pertussis, and hundreds of cases of tetanus, were reported in the [place] each year. Since vaccination began, reports of cases for tetanus and diphtheria have dropped by about 99% and for pertussis by about 80%. 2. Tdap vaccine Tdap vaccine can protect adolescents and adults from tetanus, diphtheria, and pertussis. One dose of Tdap is routinely given at age 11 or 12. People who did not get Tdap at that age should get it as soon as possible. Tdap is especially important  for healthcare professionals and anyone having close contact with a baby younger than 12 months. Pregnant women should get a dose of Tdap during every pregnancy, to protect the newborn from pertussis. Infants are most at risk for severe, life-threatening complications from pertussis. Another vaccine, called Td, protects against tetanus and diphtheria, but not pertussis. A Td booster should be given every 10 years. Tdap may be given as one of these boosters if you have never gotten Tdap before. Tdap may also be given after a severe cut or burn to prevent tetanus infection. Your doctor or the person giving you the vaccine can give you more information. Tdap may safely be given at the same time as other vaccines. 3. Some people should not get this vaccine  A person who has ever had a life-threatening allergic reaction after a previous dose of any diphtheria, tetanus or pertussis containing vaccine, OR has a severe allergy to any part of this vaccine, should not get Tdap vaccine. Tell the person giving the vaccine about any severe allergies.  Anyone who had coma or long repeated seizures within 7 days after a childhood dose of DTP or DTaP, or a previous dose of Tdap, should not get Tdap, unless a cause other than the vaccine was found. They can still get Td.  Talk to your doctor if you:  have seizures or another nervous system problem,  had severe pain or swelling after any vaccine containing diphtheria, tetanus or pertussis,  ever had a condition called Guillain-Barr Syndrome (GBS),  aren't feeling well on the day the shot is scheduled. 4. Risks With any medicine, including vaccines, there is   a chance of side effects. These are usually mild and go away on their own. Serious reactions are also possible but are rare. Most people who get Tdap vaccine do not have any problems with it. Mild problems following Tdap (Did not interfere with activities)  Pain where the shot was given (about 3 in 4  adolescents or 2 in 3 adults)  Redness or swelling where the shot was given (about 1 person in 5)  Mild fever of at least 100.4F (up to about 1 in 25 adolescents or 1 in 100 adults)  Headache (about 3 or 4 people in 10)  Tiredness (about 1 person in 3 or 4)  Nausea, vomiting, diarrhea, stomach ache (up to 1 in 4 adolescents or 1 in 10 adults)  Chills, sore joints (about 1 person in 10)  Body aches (about 1 person in 3 or 4)  Rash, swollen glands (uncommon) Moderate problems following Tdap (Interfered with activities, but did not require medical attention)  Pain where the shot was given (up to 1 in 5 or 6)  Redness or swelling where the shot was given (up to about 1 in 16 adolescents or 1 in 12 adults)  Fever over 102F (about 1 in 100 adolescents or 1 in 250 adults)  Headache (about 1 in 7 adolescents or 1 in 10 adults)  Nausea, vomiting, diarrhea, stomach ache (up to 1 or 3 people in 100)  Swelling of the entire arm where the shot was given (up to about 1 in 500). Severe problems following Tdap (Unable to perform usual activities; required medical attention)  Swelling, severe pain, bleeding and redness in the arm where the shot was given (rare). Problems that could happen after any vaccine:  People sometimes faint after a medical procedure, including vaccination. Sitting or lying down for about 15 minutes can help prevent fainting, and injuries caused by a fall. Tell your doctor if you feel dizzy, or have vision changes or ringing in the ears.  Some people get severe pain in the shoulder and have difficulty moving the arm where a shot was given. This happens very rarely.  Any medication can cause a severe allergic reaction. Such reactions from a vaccine are very rare, estimated at fewer than 1 in a million doses, and would happen within a few minutes to a few hours after the vaccination. As with any medicine, there is a very remote chance of a vaccine causing a serious  injury or death. The safety of vaccines is always being monitored. For more information, visit: www.cdc.gov/vaccinesafety/ 5. What if there is a serious problem? What should I look for?  Look for anything that concerns you, such as signs of a severe allergic reaction, very high fever, or unusual behavior.  Signs of a severe allergic reaction can include hives, swelling of the face and throat, difficulty breathing, a fast heartbeat, dizziness, and weakness. These would usually start a few minutes to a few hours after the vaccination. What should I do?  If you think it is a severe allergic reaction or other emergency that can't wait, call 9-1-1 or get the person to the nearest hospital. Otherwise, call your doctor.  Afterward, the reaction should be reported to the Vaccine Adverse Event Reporting System (VAERS). Your doctor might file this report, or you can do it yourself through the VAERS web site at www.vaers.hhs.gov, or by calling 1-800-822-7967. VAERS does not give medical advice.  6. The National Vaccine Injury Compensation Program The National Vaccine Injury Compensation Program (  VICP) is a federal program that was created to compensate people who may have been injured by certain vaccines. Persons who believe they may have been injured by a vaccine can learn about the program and about filing a claim by calling 1-727 208 0089 or visiting the VICP website at SpiritualWord.at. There is a time limit to file a claim for compensation. 7. How can I learn more?  Ask your doctor. He or she can give you the vaccine package insert or suggest other sources of information.  Call your local or state health department.  Contact the Centers for Disease Control and Prevention (CDC):  Call (312)545-7742 (1-800-CDC-INFO) or  Visit CDC's website at PicCapture.uy CDC Tdap Vaccine VIS (10/19/13)   This information is not intended to replace advice given to you by your health care  provider. Make sure you discuss any questions you have with your health care provider.   Document Released: 02/11/2012 Document Revised: 09/02/2014 Document Reviewed: 11/24/2013 Elsevier Interactive Patient Education 2016 [employer].   Breastfeeding Challenges and Solutions Even though breastfeeding is natural, it can be challenging, especially in the first few weeks after childbirth. It is normal for problems to arise when starting to breastfeed your new baby, even if you have breastfed before. This document provides some solutions to the most common breastfeeding challenges.  CHALLENGES AND SOLUTIONS Challenge--Cracked or Sore Nipples Cracked or sore nipples are commonly experienced by breastfeeding mothers. Cracked or sore nipples often are caused by inadequate latching (when your baby's mouth attaches to your breast to breastfeed). Soreness can also happen if your baby is not positioned properly at your breast. Although nipple cracking and soreness are common during the first week after birth, nipple pain is never normal. If you experience nipple cracking or soreness that lasts longer than 1 week or nipple pain, call your health care provider or lactation consultant.  Solution Ensure proper latching and positioning of your baby by following the steps below:  Find a comfortable place to sit or lie down, with your neck and back well supported.  Place a pillow or rolled up blanket under your baby to bring him or her to the level of your breast (if you are seated).  Make sure that your baby's abdomen is facing your abdomen.  Gently massage your breast. With your fingertips, massage from your chest wall toward your nipple in a circular motion. This encourages milk flow. You may need to continue this action during the feeding if your milk flows slowly.  Support your breast with 4 fingers underneath and your thumb above your nipple. Make sure your fingers are well away from your nipple and your  baby's mouth.  Stroke your baby's lips gently with your finger or nipple.  When your baby's mouth is open wide enough, quickly bring your baby to your breast, placing your entire nipple and as much of the colored area around your nipple (areola) as possible into your baby's mouth.  More areola should be visible above your baby's upper lip than below the lower lip.  Your baby's tongue should be between his or her lower gum and your breast.  Ensure that your baby's mouth is correctly positioned around your nipple (latched). Your baby's lips should create a seal on your breast and be turned out (everted).  It is common for your baby to suck for about 2-3 minutes in order to start the flow of breast milk. Signs that your baby has successfully latched on to your nipple include:   Quietly  tugging or quietly sucking without causing you pain.   Swallowing heard between every 3-4 sucks.   Muscle movement above and in front of his or her ears with sucking.  Signs that your baby has not successfully latched on to nipple include:   Sucking sounds or smacking sounds from your baby while nursing.   Nipple pain.  Ensure that your breasts stay moisturized and healthy by:  Avoiding the use of soap on your nipples.   Wearing a supportive bra. Avoid wearing underwire-style bras or tight bras.   Air drying your nipples for 3-4 minutes after each feeding.   Using only cotton bra pads to absorb breast milk leakage. Leaking of breast milk between feedings is normal. Be sure to change the pads if they become soaked with milk.  Using lanolin on your nipples after nursing. Lanolin helps to maintain your skin's normal moisture barrier. If you use pure lanolin you do not need to wash it off before feeding your baby again. Pure lanolin is not toxic to your baby. You may also hand express a few drops of breast milk and gently massage that milk into your nipples, allowing it to air  dry. Challenge--Breast Engorgement Breast engorgement is the overfilling of your breasts with breast milk. In the first few weeks after giving birth, you may experience breast engorgement. Breast engorgement can make your breasts throb and feel hard, tightly stretched, warm, and tender. Engorgement peaks about the fifth day after you give birth. Having breast engorgement does not mean you have to stop breastfeeding your baby. Solution  Breastfeed when you feel the need to reduce the fullness of your breasts or when your baby shows signs of hunger. This is called "breastfeeding on demand."  Newborns (babies younger than 4 weeks) often breastfeed every 1-3 hours during the day. You may need to awaken your baby to feed if he or she is asleep at a feeding time.  Do not allow your baby to sleep longer than 5 hours during the night without a feeding.  Pump or hand express breast milk before breastfeeding to soften your breast, areola, and nipple.  Apply warm, moist heat (in the shower or with warm water-soaked hand towels) just before feeding or pumping, or massage your breast before or during breastfeeding. This increases circulation and helps your milk to flow.  Completely empty your breasts when breastfeeding or pumping. Afterward, wear a snug bra (nursing or regular) or tank top for 1-2 days to signal your body to slightly decrease milk production. Only wear snug bras or tank tops to treat engorgement. Tight bras typically should be avoided by breastfeeding mothers. Once engorgement is relieved, return to wearing regular, loose-fitting clothes.  Apply ice packs to your breasts to lessen the pain from engorgement and relieve swelling, unless the ice is uncomfortable for you.  Do not delay feedings. Try to relax when it is time to feed your baby. This helps to trigger your "let-down reflex," which releases milk from your breast.  Ensure your baby is latched on to your breast and positioned properly  while breastfeeding.  Allow your baby to remain at your breast as long as he or she is latched on well and actively sucking. Your baby will let you know when he or she is done breastfeeding by pulling away from your breast or falling asleep.  Avoid introducing bottles or pacifiers to your baby in the early weeks of breastfeeding. Wait to introduce these things until after resolving any breastfeeding challenges.  Try to pump your milk on the same schedule as when your baby would breastfeed if you are returning to work or away from home for an extended period.  Drink plenty of fluids to avoid dehydration, which can eventually put you at greater risk of breast engorgement. If you follow these suggestions, your engorgement should improve in 24-48 hours. If you are still experiencing difficulty, call your lactation consultant or health care provider.  Challenge--Plugged Milk Ducts Plugged milk ducts occur when the duct does not drain milk effectively and becomes swollen. Wearing a tight-fitting nursing bra or having difficulty with latching may cause plugged milk ducts. Not drinking enough water (8-10 c [1.9-2.4 L] per day) can contribute to plugged milk ducts. Once a duct has become plugged, hard lumps, soreness, and redness may develop in your breast.  Solution Do not delay feedings. Feed your baby frequently and try to empty your breasts of milk at each feeding. Try breastfeeding from the affected side first so there is a better chance that the milk will drain completely from that breast. Apply warm, moist towels to your breasts for 5-10 minutes before feeding. Alternatively, a hot shower right before breastfeeding can provide the moist heat that can encourage milk flow. Gentle massage of the sore area before and during a feeding may also help. Avoid wearing tight clothing or bras that put pressure on your breasts. Wear bras that offer good support to your breasts, but avoid underwire bras. If you have a  plugged milk duct and develop a fever, you need to see your health care provider.  Challenge--Mastitis Mastitis is inflammation of your breast. It usually is caused by a bacterial infection and can cause flu-like symptoms. You may develop redness in your breast and a fever. Often when mastitis occurs, your breast becomes firm, warm, and very painful. The most common causes of mastitis are poor latching, ineffective sucking from your baby, consistent pressure on your breast (possibly from wearing a tight-fitting bra or shirt that restricts the milk flow), unusual stress or fatigue, or missed feedings.  Solution You will be given antibiotic medicine to treat the infection. It is still important to breastfeed frequently to empty your breasts. Continuing to breastfeed while you recover from mastitis will not harm your baby. Make sure your baby is positioned properly during every feeding. Apply moist heat to your breasts for a few minutes before feeding to help the milk flow and to help your breasts empty more easily. Challenge--Thrush Ginette Pitmanhrush is a yeast infection that can form on your nipples, in your breast, or in your baby's mouth. It causes itching, soreness, burning or stabbing pain, and sometimes a rash.  Solution You will be given a medicated ointment for your nipples, and your baby will be given a liquid medicine for his or her mouth. It is important that you and your baby are treated at the same time because thrush can be passed between you and your baby. Change disposable nursing pads often. Any bras, towels, or clothing that come in contact with infected areas of your body or your baby's body need to be washed in very hot water every day. Wash your hands and your baby's hands often. All pacifiers, bottle nipples, or toys your baby puts in his or her mouth should be boiled once a day for 20 minutes. After 1 week of treatment, discard pacifiers and bottle nipples and buy new ones. All breast pump parts  that touch the milk need to be boiled for 20  minutes every day. Challenge--Low Milk Supply You may not be producing enough milk if your baby is not gaining the proper amount of weight. Breast milk production is based on a supply-and-demand system. Your milk supply depends on how frequently and effectively your baby empties your breast. Solution The more you breastfeed and pump, the more breast milk you will produce. It is important that your baby empties at least one of your breasts at each feeding. If this is not happening, then use a breast pump or hand express any milk that remains. This will help to drain as much milk as possible at each feeding. It will also signal your body to produce more milk. If your baby is not emptying your breasts, it may be due to latching, sucking, or positioning problems. If low milk supply continues after addressing these issues, contact your health care provider or a lactation specialist as soon as possible. Challenge--Inverted or Flat Nipples Some women have nipples that turn inward instead of protruding outward. Other women have nipples that are flat. Inverted or flat nipples can sometimes make it more difficult for your baby to latch onto your breast. Solution You may be given a small device that pulls out inverted nipples. This device should be applied right before your baby is brought to your breast. You can also try using a breast pump for a short time before placing the baby at your breast. The pump can pull your nipple outwards to help your infant latch more easily. The baby's sucking motion will help the inverted nipple protrude as well.  If you have flat nipples, encourage your baby to latch onto your breast and feed frequently in the early days after birth. This will give your baby practice latching on correctly while your breast is still soft. When your milk supply increases, between the second and fifth day after birth and your breasts become full, your baby  will have an easier time latching.  Contact a lactation consultant if you still have concerns. She or he can teach you additional techniques to address breastfeeding problems related to nipple shape and position.  FOR MORE INFORMATION [employer]: www.llli.org   This information is not intended to replace advice given to you by your health care provider. Make sure you discuss any questions you have with your health care provider.   Document Released: 02/03/2006 Document Revised: 09/02/2014 Document Reviewed: 02/05/2013 Elsevier Interactive Patient Education [employer]

## 2016-03-08 LAB — HIV ANTIBODY (ROUTINE TESTING W REFLEX): HIV 1&2 Ab, 4th Generation: NONREACTIVE

## 2016-03-08 LAB — RPR

## 2016-03-08 LAB — GLUCOSE TOLERANCE, 1 HOUR (50G) W/O FASTING: GLUCOSE, 1 HR, GESTATIONAL: 68 mg/dL (ref <140)

## 2016-03-27 ENCOUNTER — Ambulatory Visit (INDEPENDENT_AMBULATORY_CARE_PROVIDER_SITE_OTHER): Payer: Medicaid Other | Admitting: Obstetrics and Gynecology

## 2016-03-27 ENCOUNTER — Ambulatory Visit (INDEPENDENT_AMBULATORY_CARE_PROVIDER_SITE_OTHER): Payer: Medicaid Other | Admitting: Clinical

## 2016-03-27 DIAGNOSIS — F4322 Adjustment disorder with anxiety: Secondary | ICD-10-CM

## 2016-03-27 DIAGNOSIS — Z3482 Encounter for supervision of other normal pregnancy, second trimester: Secondary | ICD-10-CM

## 2016-03-27 LAB — POCT URINALYSIS DIP (DEVICE)
BILIRUBIN URINE: NEGATIVE
Glucose, UA: NEGATIVE mg/dL
Hgb urine dipstick: NEGATIVE
KETONES UR: NEGATIVE mg/dL
LEUKOCYTES UA: NEGATIVE
NITRITE: NEGATIVE
Protein, ur: NEGATIVE mg/dL
Specific Gravity, Urine: 1.015 (ref 1.005–1.030)
Urobilinogen, UA: 0.2 mg/dL (ref 0.0–1.0)
pH: 7 (ref 5.0–8.0)

## 2016-03-27 NOTE — Progress Notes (Signed)
Declines tdap 

## 2016-03-27 NOTE — Patient Instructions (Addendum)
Td Vaccine (Tetanus and Diphtheria): What You Need to Know 1. Why get vaccinated? Tetanus  and diphtheria are very serious diseases. They are rare in the [place] today, but people who do become infected often have severe complications. Td vaccine is used to protect adolescents and adults from both of these diseases. Both tetanus and diphtheria are infections caused by bacteria. Diphtheria spreads from person to person through coughing or sneezing. Tetanus-causing bacteria enter the body through cuts, scratches, or wounds. TETANUS (Lockjaw) causes painful muscle tightening and stiffness, usually all over the body.  It can lead to tightening of muscles in the head and neck so you can't open your mouth, swallow, or sometimes even breathe. Tetanus kills about 1 out of every 10 people who are infected even after receiving the best medical care. DIPHTHERIA can cause a thick coating to form in the back of the throat.  It can lead to breathing problems, paralysis, heart failure, and death. Before vaccines, as many as 200,000 cases of diphtheria and hundreds of cases of tetanus were reported in the [place] each year. Since vaccination began, reports of cases for both diseases have dropped by about 99%. 2. Td vaccine Td vaccine can protect adolescents and adults from tetanus and diphtheria. Td is usually given as a booster dose every 10 years but it can also be given earlier after a severe and dirty wound or burn. Another vaccine, called Tdap, which protects against pertussis in addition to tetanus and diphtheria, is sometimes recommended instead of Td vaccine. Your doctor or the person giving you the vaccine can give you more information. Td may safely be given at the same time as other vaccines. 3. Some people should not get this vaccine  A person who has ever had a life-threatening allergic reaction after a previous dose of any tetanus or diphtheria containing vaccine, OR has a severe allergy  to any part of this vaccine, should not get Td vaccine. Tell the person giving the vaccine about any severe allergies.  Talk to your doctor if you:  have seizures or another nervous system problem,  had severe pain or swelling after any vaccine containing diphtheria or tetanus,  ever had a condition called Guillain Barre Syndrome (GBS),  aren't feeling well on the day the shot is scheduled. 4. Risks of a vaccine reaction With any medicine, including vaccines, there is a chance of side effects. These are usually mild and go away on their own. Serious reactions are also possible but are rare. Most people who get Td vaccine do not have any problems with it. Mild Problems  following Td vaccine: (Did not interfere with activities)  Pain where the shot was given (about 8 people in 10)  Redness or swelling where the shot was given (about 1 person in 4)  Mild fever (rare)  Headache (about 1 person in 4)  Tiredness (about 1 person in 4) Moderate Problems following Td vaccine: (Interfered with activities, but did not require medical attention)  Fever over 102F (rare) Severe Problems  following Td vaccine: (Unable to perform usual activities; required medical attention)  Swelling, severe pain, bleeding and/or redness in the arm where the shot was given (rare). Problems that could happen after any vaccine:  People sometimes faint after a medical procedure, including vaccination. Sitting or lying down for about 15 minutes can help prevent fainting, and injuries caused by a fall. Tell your doctor if you feel dizzy, or have vision changes or ringing in the ears.  Some people get severe pain in the shoulder and have difficulty moving the arm where a shot was given. This happens very rarely.  Any medication can cause a severe allergic reaction. Such reactions from a vaccine are very rare, estimated at fewer than 1 in a million doses, and would happen within a few minutes to a few hours after  the vaccination. As with any medicine, there is a very remote chance of a vaccine causing a serious injury or death. The safety of vaccines is always being monitored. For more information, visit: http://floyd.org/ 5. What if there is a serious reaction? What should I look for?  Look for anything that concerns you, such as signs of a severe allergic reaction, very high fever, or unusual behavior. Signs of a severe allergic reaction can include hives, swelling of the face and throat, difficulty breathing, a fast heartbeat, dizziness, and weakness. These would usually start a few minutes to a few hours after the vaccination. What should I do?  If you think it is a severe allergic reaction or other emergency that can't wait, call 9-1-1 or get the person to the nearest hospital. Otherwise, call your doctor.  Afterward, the reaction should be reported to the Vaccine Adverse Event Reporting System (VAERS). Your doctor might file this report, or you can do it yourself through the VAERS web site at www.vaers.LAgents.no, or by calling 1-539-260-3173. VAERS does not give medical advice. 6. The National Vaccine Injury Compensation Program The [employer] Vaccine Injury Compensation Program (VICP) is a federal program that was created to compensate people who may have been injured by certain vaccines. Persons who believe they may have been injured by a vaccine can learn about the program and about filing a claim by calling 1-217-538-8181 or visiting the VICP website at SpiritualWord.at. There is a time limit to file a claim for compensation. 7. How can I learn more?  Ask your doctor. He or she can give you the vaccine package insert or suggest other sources of information.  Call your local or state health department.  Contact the Centers for Disease Control and Prevention (CDC):  Call 567 827 5552 (1-800-CDC-INFO)  Visit CDC's website at PicCapture.uy CDC Td Vaccine VIS  (10/19/13)   This information is not intended to replace advice given to you by your health care provider. Make sure you discuss any questions you have with your health care provider.   Document Released: 06/09/2006 Document Revised: 09/02/2014 Document Reviewed: 11/24/2013 Elsevier Interactive Patient Education [employer] Third Trimester of Pregnancy The third trimester is from week 29 through week 42, months 7 through 9. The third trimester is a time when the fetus is growing rapidly. At the end of the ninth month, the fetus is about 20 inches in length and weighs 6-10 pounds.  BODY CHANGES Your body goes through many changes during pregnancy. The changes vary from woman to woman.   Your weight will continue to increase. You can expect to gain 25-35 pounds (11-16 kg) by the end of the pregnancy.  You may begin to get stretch marks on your hips, abdomen, and breasts.  You may urinate more often because the fetus is moving lower into your pelvis and pressing on your bladder.  You may develop or continue to have heartburn as a result of your pregnancy.  You may develop constipation because certain hormones are causing the muscles that push waste through your intestines to slow down.  You may develop hemorrhoids or swollen, bulging veins (varicose veins).  You may have pelvic pain because of the weight gain and pregnancy hormones relaxing your joints between the bones in your pelvis. Backaches may result from overexertion of the muscles supporting your posture.  You may have changes in your hair. These can include thickening of your hair, rapid growth, and changes in texture. Some women also have hair loss during or after pregnancy, or hair that feels dry or thin. Your hair will most likely return to normal after your baby is born.  Your breasts will continue to grow and be tender. A yellow discharge may leak from your breasts called colostrum.  Your belly button may stick  out.  You may feel short of breath because of your expanding uterus.  You may notice the fetus "dropping," or moving lower in your abdomen.  You may have a bloody mucus discharge. This usually occurs a few days to a week before labor begins.  Your cervix becomes thin and soft (effaced) near your due date. WHAT TO EXPECT AT YOUR PRENATAL EXAMS  You will have prenatal exams every 2 weeks until week 36. Then, you will have weekly prenatal exams. During a routine prenatal visit:  You will be weighed to make sure you and the fetus are growing normally.  Your blood pressure is taken.  Your abdomen will be measured to track your baby's growth.  The fetal heartbeat will be listened to.  Any test results from the previous visit will be discussed.  You may have a cervical check near your due date to see if you have effaced. At around 36 weeks, your caregiver will check your cervix. At the same time, your caregiver will also perform a test on the secretions of the vaginal tissue. This test is to determine if a type of bacteria, Group B streptococcus, is present. Your caregiver will explain this further. Your caregiver may ask you:  What your birth plan is.  How you are feeling.  If you are feeling the baby move.  If you have had any abnormal symptoms, such as leaking fluid, bleeding, severe headaches, or abdominal cramping.  If you are using any tobacco products, including cigarettes, chewing tobacco, and electronic cigarettes.  If you have any questions. Other tests or screenings that may be performed during your third trimester include:  Blood tests that check for low iron levels (anemia).  Fetal testing to check the health, activity level, and growth of the fetus. Testing is done if you have certain medical conditions or if there are problems during the pregnancy.  HIV (human immunodeficiency virus) testing. If you are at high risk, you may be screened for HIV during your third  trimester of pregnancy. FALSE LABOR You may feel small, irregular contractions that eventually go away. These are called Braxton Hicks contractions, or false labor. Contractions may last for hours, days, or even weeks before true labor sets in. If contractions come at regular intervals, intensify, or become painful, it is best to be seen by your caregiver.  SIGNS OF LABOR   Menstrual-like cramps.  Contractions that are 5 minutes apart or less.  Contractions that start on the top of the uterus and spread down to the lower abdomen and back.  A sense of increased pelvic pressure or back pain.  A watery or bloody mucus discharge that comes from the vagina. If you have any of these signs before the 37th week of pregnancy, call your caregiver right away. You need to go to the hospital to get checked immediately.  HOME CARE INSTRUCTIONS   Avoid all smoking, herbs, alcohol, and unprescribed drugs. These chemicals affect the formation and growth of the baby.  Do not use any tobacco products, including cigarettes, chewing tobacco, and electronic cigarettes. If you need help quitting, ask your health care provider. You may receive counseling support and other resources to help you quit.  Follow your caregiver's instructions regarding medicine use. There are medicines that are either safe or unsafe to take during pregnancy.  Exercise only as directed by your caregiver. Experiencing uterine cramps is a good sign to stop exercising.  Continue to eat regular, healthy meals.  Wear a good support bra for breast tenderness.  Do not use hot tubs, steam rooms, or saunas.  Wear your seat belt at all times when driving.  Avoid raw meat, uncooked cheese, cat litter boxes, and soil used by cats. These carry germs that can cause birth defects in the baby.  Take your prenatal vitamins.  Take 1500-2000 mg of calcium daily starting at the 20th week of pregnancy until you deliver your baby.  Try taking a  stool softener (if your caregiver approves) if you develop constipation. Eat more high-fiber foods, such as fresh vegetables or fruit and whole grains. Drink plenty of fluids to keep your urine clear or pale yellow.  Take warm sitz baths to soothe any pain or discomfort caused by hemorrhoids. Use hemorrhoid cream if your caregiver approves.  If you develop varicose veins, wear support hose. Elevate your feet for 15 minutes, 3-4 times a day. Limit salt in your diet.  Avoid heavy lifting, wear low heal shoes, and practice good posture.  Rest a lot with your legs elevated if you have leg cramps or low back pain.  Visit your dentist if you have not gone during your pregnancy. Use a soft toothbrush to brush your teeth and be gentle when you floss.  A sexual relationship may be continued unless your caregiver directs you otherwise.  Do not travel far distances unless it is absolutely necessary and only with the approval of your caregiver.  Take prenatal classes to understand, practice, and ask questions about the labor and delivery.  Make a trial run to the hospital.  Pack your hospital bag.  Prepare the baby's nursery.  Continue to go to all your prenatal visits as directed by your caregiver. SEEK MEDICAL CARE IF:  You are unsure if you are in labor or if your water has broken.  You have dizziness.  You have mild pelvic cramps, pelvic pressure, or nagging pain in your abdominal area.  You have persistent nausea, vomiting, or diarrhea.  You have a bad smelling vaginal discharge.  You have pain with urination. SEEK IMMEDIATE MEDICAL CARE IF:   You have a fever.  You are leaking fluid from your vagina.  You have spotting or bleeding from your vagina.  You have severe abdominal cramping or pain.  You have rapid weight loss or gain.  You have shortness of breath with chest pain.  You notice sudden or extreme swelling of your face, hands, ankles, feet, or legs.  You have  not felt your baby move in over an hour.  You have severe headaches that do not go away with medicine.  You have vision changes.   This information is not intended to replace advice given to you by your health care provider. Make sure you discuss any questions you have with your health care provider.   Document Released: 08/06/2001 Document Revised: 09/02/2014 Document  Reviewed: 10/13/2012 Elsevier Interactive Patient Education [employer]

## 2016-03-27 NOTE — Progress Notes (Signed)
  ASSESSMENT: Pt currently experiencing Adjustment reaction with anxious mood. Pt needs to f/u with OB. Pt would benefit from community resources.  Stage of Change: contemplative  PLAN: 1. F/U with behavioral health clinician as needed 2. Psychiatric Medications: none 3. Behavioral recommendations:   -Call [employer] to ask about qualifications for program -Consider talking to FOB about her health care options regarding insurance -Consider [employer] for legal resources and/or additional support  SUBJECTIVE: Pt. referred by Venia Carbon, NP, for emotional response Pt. reports the following symptoms/concerns: Pt distressed over finding out that, in order to continue with pregnancy Medicaid, she will have to pursue child support, and she does not wish to do so. Pt wants to find out if she qualifies for adopt-a-mom and if she should simply cancel her Medicaid.  Duration of problem: At least one day Severity: mild   OBJECTIVE: Orientation & Cognition: Oriented x3. Thought processes normal and appropriate to situation. 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  -------------------------------------------- Other(s) present in the room: none  Time spent with patient in exam room: 20 minutes, 10:05-10:25am  Depression screen [facility] 2/9 03/27/2016  Decreased Interest 0  Down, Depressed, Hopeless 0  PHQ - 2 Score 0  Altered sleeping 0  Tired, decreased energy 0  Change in appetite 0  Feeling bad or failure about yourself  0  Trouble concentrating 0  Moving slowly or fidgety/restless 0  Suicidal thoughts 0  PHQ-9 Score 0   GAD 7 : Generalized Anxiety Score 03/27/2016  Nervous, Anxious, on Edge 0  Control/stop worrying 0  Worry too much - different things 1   Trouble relaxing 0  Restless 0  Easily annoyed or irritable 0  Afraid - awful might happen 0  Total GAD 7 Score 1

## 2016-03-27 NOTE — Progress Notes (Signed)
Subjective:  Jacqueline Bowen is a 30 y.o. G3P1011 at [redacted]w[redacted]d being seen today for ongoing prenatal care.  She is currently monitored for the following issues for this low-risk pregnancy and has History of cesarean section complicating pregnancy and Supervision of normal pregnancy, antepartum on her problem list.  Patient reports no complaints.  Contractions: Not present. Vag. Bleeding: None.  Movement: Present. Denies leaking of fluid. Patient concerned about her financial situation. No longer working, concerned about insurance for her children.  The following portions of the patient's history were reviewed and updated as appropriate: allergies, current medications, past family history, past medical history, past social history, past surgical history and problem list. Problem list updated.  Objective:   Vitals:   03/27/16 0831  BP: 119/61  Pulse: 86  Weight: 169 lb 1.6 oz (76.7 kg)    Fetal Status: Fetal Heart Rate (bpm): 152   Movement: Present     General:  Alert, oriented and cooperative. Patient is in no acute distress.  Skin: Skin is warm and dry. No rash noted.   Cardiovascular: Normal heart rate noted  Respiratory: Normal respiratory effort, no problems with respiration noted  Abdomen: Soft, gravid, appropriate for gestational age. Pain/Pressure: Present     Pelvic:  Cervical exam deferred        Extremities: Normal range of motion.  Edema: None  Mental Status: Normal mood and affect. Normal behavior. Normal judgment and thought content.   Urinalysis: Urine Protein: Negative Urine Glucose: Negative  Assessment and Plan:  Pregnancy: G3P1011 at [redacted]w[redacted]d  1. Supervision of normal pregnancy, antepartum, second trimester  - declined Tdap, information given. Discussed the importance of Tdap. - Social work consult  - return in 2 weeks  - reviewed 3rd trimester labs    Preterm labor symptoms and general obstetric precautions including but not limited to vaginal bleeding,  contractions, leaking of fluid and fetal movement were reviewed in detail with the patient. Please refer to After Visit Summary for other counseling recommendations.  Return in about 2 weeks (around 04/10/2016).   Duane Lope, NP

## 2016-04-10 ENCOUNTER — Ambulatory Visit (INDEPENDENT_AMBULATORY_CARE_PROVIDER_SITE_OTHER): Payer: Medicaid Other | Admitting: Advanced Practice Midwife

## 2016-04-10 DIAGNOSIS — O34219 Maternal care for unspecified type scar from previous cesarean delivery: Secondary | ICD-10-CM

## 2016-04-10 DIAGNOSIS — Z3493 Encounter for supervision of normal pregnancy, unspecified, third trimester: Secondary | ICD-10-CM | POA: Diagnosis not present

## 2016-04-10 LAB — POCT URINALYSIS DIP (DEVICE)
Bilirubin Urine: NEGATIVE
Glucose, UA: NEGATIVE mg/dL
HGB URINE DIPSTICK: NEGATIVE
KETONES UR: NEGATIVE mg/dL
Leukocytes, UA: NEGATIVE
NITRITE: NEGATIVE
PH: 6.5 (ref 5.0–8.0)
PROTEIN: NEGATIVE mg/dL
Specific Gravity, Urine: 1.02 (ref 1.005–1.030)
Urobilinogen, UA: 0.2 mg/dL (ref 0.0–1.0)

## 2016-04-10 NOTE — Progress Notes (Signed)
Subjective:  Jacqueline Bowen is a 30 y.o. G3P1011 at 915w2d being seen today for ongoing prenatal care.  She is currently monitored for the following issues for this low-risk pregnancy and has History of cesarean section complicating pregnancy and Supervision of normal pregnancy, antepartum on her problem list.  Patient reports no complaints.  Contractions: Not present. Vag. Bleeding: None.  Movement: Present. Denies leaking of fluid.   The following portions of the patient's history were reviewed and updated as appropriate: allergies, current medications, past family history, past medical history, past social history, past surgical history and problem list. Problem list updated.  Objective:   Vitals:   04/10/16 0857  BP: 123/76  Pulse: 99  Weight: 173 lb 4.8 oz (78.6 kg)    Fetal Status: Fetal Heart Rate (bpm): 152   Movement: Present     General:  Alert, oriented and cooperative. Patient is in no acute distress.  Skin: Skin is warm and dry. No rash noted.   Cardiovascular: Normal heart rate noted  Respiratory: Normal respiratory effort, no problems with respiration noted  Abdomen: Soft, gravid, appropriate for gestational age. Pain/Pressure: Absent     Pelvic:  Cervical exam deferred        Extremities: Normal range of motion.  Edema: None  Mental Status: Normal mood and affect. Normal behavior. Normal judgment and thought content.   Urinalysis: Urine Protein: Negative Urine Glucose: Negative  Assessment and Plan:  Pregnancy: G3P1011 at 215w2d  1. Supervision of normal pregnancy, third trimester   2. History of cesarean delivery affecting pregnancy --Desires TOLAC, consent in chart.  Preterm labor symptoms and general obstetric precautions including but not limited to vaginal bleeding, contractions, leaking of fluid and fetal movement were reviewed in detail with the patient. Please refer to After Visit Summary for other counseling recommendations.  Return in 2 weeks (on  04/24/2016).   Hurshel PartyLisa A Leftwich-Kirby, CNM

## 2016-04-10 NOTE — Patient Instructions (Signed)

## 2016-05-01 ENCOUNTER — Ambulatory Visit (INDEPENDENT_AMBULATORY_CARE_PROVIDER_SITE_OTHER): Payer: Medicaid Other | Admitting: Family

## 2016-05-01 ENCOUNTER — Other Ambulatory Visit (HOSPITAL_COMMUNITY)
Admission: RE | Admit: 2016-05-01 | Discharge: 2016-05-01 | Disposition: A | Discharge status: Home / Self Care | Payer: Medicaid Other | Source: Ambulatory Visit | Attending: Family | Admitting: Family

## 2016-05-01 VITALS — BP 119/66 | HR 69 | Wt 173.6 lb

## 2016-05-01 DIAGNOSIS — O34219 Maternal care for unspecified type scar from previous cesarean delivery: Secondary | ICD-10-CM

## 2016-05-01 DIAGNOSIS — Z3493 Encounter for supervision of normal pregnancy, unspecified, third trimester: Secondary | ICD-10-CM

## 2016-05-01 DIAGNOSIS — Z113 Encounter for screening for infections with a predominantly sexual mode of transmission: Secondary | ICD-10-CM

## 2016-05-01 DIAGNOSIS — Z3481 Encounter for supervision of other normal pregnancy, first trimester: Secondary | ICD-10-CM

## 2016-05-01 LAB — POCT URINALYSIS DIP (DEVICE)
BILIRUBIN URINE: NEGATIVE
GLUCOSE, UA: NEGATIVE mg/dL
HGB URINE DIPSTICK: NEGATIVE
LEUKOCYTES UA: NEGATIVE
NITRITE: NEGATIVE
Protein, ur: NEGATIVE mg/dL
SPECIFIC GRAVITY, URINE: 1.015 (ref 1.005–1.030)
Urobilinogen, UA: 0.2 mg/dL (ref 0.0–1.0)
pH: 7 (ref 5.0–8.0)

## 2016-05-01 LAB — OB RESULTS CONSOLE GC/CHLAMYDIA: Gonorrhea: NEGATIVE

## 2016-05-01 LAB — OB RESULTS CONSOLE GBS: STREP GROUP B AG: NEGATIVE

## 2016-05-01 NOTE — Progress Notes (Signed)
36 wk cultures today  Decline flu vaccine

## 2016-05-01 NOTE — Progress Notes (Signed)
   PRENATAL VISIT NOTE  Subjective:  Jacqueline Bowen is a 30 y.o. G3P1011 at 5334w2d being seen today for ongoing prenatal care.  She is currently monitored for the following issues for this low-risk pregnancy and has History of cesarean section complicating pregnancy and Supervision of normal pregnancy, antepartum on her problem list.  Patient reports no complaints.  Contractions: Irritability. Vag. Bleeding: None.  Movement: Present. Denies leaking of fluid.   The following portions of the patient's history were reviewed and updated as appropriate: allergies, current medications, past family history, past medical history, past social history, past surgical history and problem list. Problem list updated.  Objective:   Vitals:   05/01/16 1053  BP: 119/66  Pulse: 69  Weight: 173 lb 9.6 oz (78.7 kg)    Fetal Status: Fetal Heart Rate (bpm): 150 Fundal Height: 36 cm Movement: Present  Presentation: Vertex  General:  Alert, oriented and cooperative. Patient is in no acute distress.  Skin: Skin is warm and dry. No rash noted.   Cardiovascular: Normal heart rate noted  Respiratory: Normal respiratory effort, no problems with respiration noted  Abdomen: Soft, gravid, appropriate for gestational age. Pain/Pressure: Present     Pelvic:  Cervical exam deferred Dilation: Closed Effacement (%): Thick    Extremities: Normal range of motion.  Edema: None  Mental Status: Normal mood and affect. Normal behavior. Normal judgment and thought content.   Urinalysis: Urine Protein: Negative Urine Glucose: Negative  Assessment and Plan:  Pregnancy: G3P1011 at 6534w2d  1. Supervision of low-risk pregnancy, third trimester - [facility]Chlam[facility] - Culture, beta strep (group b only)  2.  History of cesarean section complicating pregnancy - TOLAC consent previously signed  Preterm labor symptoms and general obstetric precautions including but not limited to vaginal bleeding,  contractions, leaking of fluid and fetal movement were reviewed in detail with the patient. Please refer to After Visit Summary for other counseling recommendations.  Return in 1 week (on 05/08/2016).  Eino FarberWalidah Kennith GainN Karim, CNM

## 2016-05-01 NOTE — Patient Instructions (Signed)
Trial of Labor After Cesarean Delivery Information A trial of labor after cesarean delivery (TOLAC) is when a woman tries to give birth vaginally after a previous cesarean delivery. TOLAC may be a safe and appropriate option for you depending on your medical history and other risk factors. When TOLAC is successful and you are able to have a vaginal delivery, this is called a vaginal birth after cesarean delivery (VBAC).  CANDIDATES FOR TOLAC TOLAC is possible for some women who:  Have undergone one or two prior cesarean deliveries in which the incision of the uterus was horizontal (low transverse).  Are carrying twins and have had one prior low transverse incision during a cesarean delivery.  Do not have a vertical (classical) uterine scar.  Have not had a tear in the wall of their uterus (uterine rupture). TOLAC is also supported for women who meet appropriate criteria and:  Are under the age of 40 years.  Are tall and have a body mass index (BMI) of less than 30.  Have an unknown uterine scar.  Give birth in a facility equipped to handle an emergency cesarean delivery. This team should be able to handle possible complications such as a uterine rupture.  Have thorough counseling about the benefits and risks of TOLAC.  Have discussed future pregnancy plans with their health care provider.  Plan to have several more pregnancies. MOST SUCCESSFUL CANDIDATES FOR TOLAC:  Have had a successful vaginal delivery before or after their cesarean delivery.  Experience labor that begins naturally on or before the due date (40 weeks of gestation).  Do not have a very large (macrosomic) baby.   Had a prior cesarean delivery but are not currently experiencing factors that would prompt a cesarean delivery (such as a breech position).  Had only one prior cesarean delivery.  Had a prior cesarean delivery that was performed early in labor and not after full cervical dilation. TOLAC may be most  appropriate for women who meet the above guidelines and who plan to have more pregnancies. TOLAC is not recommended for home births. LEAST SUCCESSFUL CANDIDATES FOR TOLAC:  Have an induced labor with an unfavorable cervix. An unfavorable cervix is when the cervix is not dilating enough (among other factors).  Have never had a vaginal delivery.  Have had more than two cesarean deliveries.  Have a pregnancy at more than 40 weeks of gestation.  Are pregnant with a baby with a suspected weight greater than 4,000 grams (8 pounds) and who have no prior history of a vaginal delivery.  Have closely spaced pregnancies. SUGGESTED BENEFITS OF TOLAC  You may have a faster recovery time.  You may have a shorter stay in the hospital.  You may have less pain and fewer problems than with a cesarean delivery. Women who have a cesarean delivery have a higher chance of needing blood or getting a fever, an infection, or a blood clot in the legs. SUGGESTED RISKS OF TOLAC The highest risk of complications happens to women who attempt a TOLAC and fail. A failed TOLAC results in an unplanned cesarean delivery. Risks related to TOLAC or repeat cesarean deliveries include:   Blood loss.  Infection.  Blood clot.  Injury to surrounding tissues or organs.  Having to remove the uterus (hysterectomy).  Potential problems with the placenta (such as placenta previa or placenta accreta) in future pregnancies. Although very rare, the main concerns with TOLAC are:  Rupture of the uterine scar from a past cesarean delivery.  Needing an   emergency cesarean delivery.  Having a bad outcome for the baby (perinatal morbidity). FOR MORE INFORMATION American Congress of Obstetricians and Gynecologists: www.acog.org American College of Nurse-Midwives: www.midwife.org   This information is not intended to replace advice given to you by your health care provider. Make sure you discuss any questions you have with  your health care provider.   Document Released: 04/30/2011 Document Revised: 06/02/2013 Document Reviewed: 02/01/2013 Elsevier Interactive Patient Education 2016 Elsevier Inc.  

## 2016-05-02 LAB — GC/CHLAMYDIA PROBE AMP (~~LOC~~) NOT AT ARMC
Chlamydia: NEGATIVE
NEISSERIA GONORRHEA: NEGATIVE

## 2016-05-03 LAB — CULTURE, BETA STREP (GROUP B ONLY)

## 2016-05-08 ENCOUNTER — Ambulatory Visit (INDEPENDENT_AMBULATORY_CARE_PROVIDER_SITE_OTHER): Payer: Medicaid Other | Admitting: Family Medicine

## 2016-05-08 VITALS — BP 118/66 | HR 79 | Wt 173.1 lb

## 2016-05-08 DIAGNOSIS — Z3483 Encounter for supervision of other normal pregnancy, third trimester: Secondary | ICD-10-CM

## 2016-05-08 LAB — POCT URINALYSIS DIP (DEVICE)
BILIRUBIN URINE: NEGATIVE
Glucose, UA: NEGATIVE mg/dL
HGB URINE DIPSTICK: NEGATIVE
Ketones, ur: NEGATIVE mg/dL
Leukocytes, UA: NEGATIVE
NITRITE: NEGATIVE
PH: 8 (ref 5.0–8.0)
PROTEIN: 30 mg/dL — AB
Specific Gravity, Urine: 1.02 (ref 1.005–1.030)
UROBILINOGEN UA: 1 mg/dL (ref 0.0–1.0)

## 2016-05-08 NOTE — Progress Notes (Signed)
Tdap vaccine today Declined flu vaccine

## 2016-05-08 NOTE — Progress Notes (Signed)
   PRENATAL VISIT NOTE  Subjective:  Jacqueline Bowen is a 30 y.o. G3P1011 at 6784w2d being seen today for ongoing prenatal care.  She is currently monitored for the following issues for this low-risk pregnancy and has History of cesarean section complicating pregnancy and Supervision of normal pregnancy, antepartum on her problem list.  Patient reports no complaints.  Contractions: Irritability. Vag. Bleeding: None.  Movement: Present. Denies leaking of fluid.   The following portions of the patient's history were reviewed and updated as appropriate: allergies, current medications, past family history, past medical history, past social history, past surgical history and problem list. Problem list updated.  Objective:   Vitals:   05/08/16 0954  BP: 118/66  Pulse: 79  Weight: 173 lb 1.6 oz (78.5 kg)    Fetal Status: Fetal Heart Rate (bpm): 147 Fundal Height: 36 cm Movement: Present     General:  Alert, oriented and cooperative. Patient is in no acute distress.  Skin: Skin is warm and dry. No rash noted.   Cardiovascular: Normal heart rate noted  Respiratory: Normal respiratory effort, no problems with respiration noted  Abdomen: Soft, gravid, appropriate for gestational age. Pain/Pressure: Present     Pelvic:  Cervical exam deferred        Extremities: Normal range of motion.  Edema: None  Mental Status: Normal mood and affect. Normal behavior. Normal judgment and thought content.   Urinalysis: Urine Protein: 1+ Urine Glucose: Negative  Assessment and Plan:  Pregnancy: G3P1011 at 6684w2d  1. Supervision of normal pregnancy, antepartum, third trimester FHT and FH normal  Preterm labor symptoms and general obstetric precautions including but not limited to vaginal bleeding, contractions, leaking of fluid and fetal movement were reviewed in detail with the patient. Please refer to After Visit Summary for other counseling recommendations.  Return in about 1 week (around  05/15/2016).  Levie HeritageJacob J Zimere Dunlevy, DO

## 2016-05-16 ENCOUNTER — Encounter: Payer: Medicaid Other | Admitting: Certified Nurse Midwife

## 2016-05-16 LAB — POCT URINALYSIS DIP (DEVICE)
BILIRUBIN URINE: NEGATIVE
GLUCOSE, UA: NEGATIVE mg/dL
Hgb urine dipstick: NEGATIVE
Ketones, ur: NEGATIVE mg/dL
LEUKOCYTES UA: NEGATIVE
NITRITE: NEGATIVE
PH: 7 (ref 5.0–8.0)
Protein, ur: 30 mg/dL — AB
Specific Gravity, Urine: 1.02 (ref 1.005–1.030)
Urobilinogen, UA: 1 mg/dL (ref 0.0–1.0)

## 2016-05-23 ENCOUNTER — Ambulatory Visit (INDEPENDENT_AMBULATORY_CARE_PROVIDER_SITE_OTHER): Payer: Medicaid Other | Admitting: Advanced Practice Midwife

## 2016-05-23 VITALS — BP 120/75 | HR 102 | Wt 174.6 lb

## 2016-05-23 DIAGNOSIS — O34219 Maternal care for unspecified type scar from previous cesarean delivery: Secondary | ICD-10-CM

## 2016-05-23 DIAGNOSIS — K219 Gastro-esophageal reflux disease without esophagitis: Secondary | ICD-10-CM

## 2016-05-23 DIAGNOSIS — O99613 Diseases of the digestive system complicating pregnancy, third trimester: Secondary | ICD-10-CM

## 2016-05-23 MED ORDER — FAMOTIDINE-CA CARB-MAG HYDROX 10-800-165 MG PO CHEW: 2.0000 | CHEWABLE_TABLET | Freq: Two times a day (BID) | ORAL | 1 refills | Status: DC | PRN

## 2016-05-23 NOTE — Progress Notes (Signed)
   PRENATAL VISIT NOTE  Subjective:  Jacqueline Bowen is a 30 y.o. G3P1011 at 7364w3d being seen today for ongoing prenatal care.  She is currently monitored for the following issues for this low-risk pregnancy and has History of cesarean section complicating pregnancy and Supervision of normal pregnancy, antepartum on her problem list.  Patient reports heartburn, occasional contractions and pelvic pressure adn pain, especially when trying to stand up or w/ prolonged standing. .  Contractions: Not present. Vag. Bleeding: None.  Movement: Present. Denies leaking of fluid.   The following portions of the patient's history were reviewed and updated as appropriate: allergies, current medications, past family history, past medical history, past social history, past surgical history and problem list. Problem list updated.  Objective:   Vitals:   05/23/16 1007  BP: 120/75  Pulse: (!) 102  Weight: 174 lb 9.6 oz (79.2 kg)    Fetal Status: Fetal Heart Rate (bpm): 139 Fundal Height: 38 cm Movement: Present  Presentation: Vertex  General:  Alert, oriented and cooperative. Patient is in no acute distress.  Skin: Skin is warm and dry. No rash noted.   Cardiovascular: Normal heart rate noted  Respiratory: Normal respiratory effort, no problems with respiration noted  Abdomen: Soft, gravid, appropriate for gestational age. Pain/Pressure: Present     Pelvic:  Cervical exam performed Dilation: Closed Effacement (%): 50 Station: -3  Extremities: Normal range of motion.  Edema: None  Mental Status: Normal mood and affect. Normal behavior. Normal judgment and thought content.   Urinalysis:      Assessment and Plan:  Pregnancy: G3P1011 at 4864w3d  1. Gastroesophageal reflux during pregnancy in third trimester, antepartum -Pepcid complete  2. Symphysis pubis pain pregnancy  - Maternity support belt  - Comfort measures  Term labor symptoms and general obstetric precautions including but not limited to  vaginal bleeding, contractions, leaking of fluid and fetal movement were reviewed in detail with the patient. Please refer to After Visit Summary for other counseling recommendations.  Return in 1 week (on 05/30/2016).  Dorathy KinsmanVirginia Ryun Velez, CNM

## 2016-05-23 NOTE — Patient Instructions (Signed)
Braxton Hicks Contractions °Contractions of the uterus can occur throughout pregnancy. Contractions are not always a sign that you are in labor.  °WHAT ARE BRAXTON HICKS CONTRACTIONS?  °Contractions that occur before labor are called Braxton Hicks contractions, or false labor. Toward the end of pregnancy (32-34 weeks), these contractions can develop more often and may become more forceful. This is not true labor because these contractions do not result in opening (dilatation) and thinning of the cervix. They are sometimes difficult to tell apart from true labor because these contractions can be forceful and people have different pain tolerances. You should not feel embarrassed if you go to the hospital with false labor. Sometimes, the only way to tell if you are in true labor is for your health care provider to look for changes in the cervix. °If there are no prenatal problems or other health problems associated with the pregnancy, it is completely safe to be sent home with false labor and await the onset of true labor. °HOW CAN YOU TELL THE DIFFERENCE BETWEEN TRUE AND FALSE LABOR? °False Labor °· The contractions of false labor are usually shorter and not as hard as those of true labor.   °· The contractions are usually irregular.   °· The contractions are often felt in the front of the lower abdomen and in the groin.   °· The contractions may go away when you walk around or change positions while lying down.   °· The contractions get weaker and are shorter lasting as time goes on.   °· The contractions do not usually become progressively stronger, regular, and closer together as with true labor.   °True Labor °· Contractions in true labor last 30-70 seconds, become very regular, usually become more intense, and increase in frequency.   °· The contractions do not go away with walking.   °· The discomfort is usually felt in the top of the uterus and spreads to the lower abdomen and low back.   °· True labor can be  determined by your health care provider with an exam. This will show that the cervix is dilating and getting thinner.   °WHAT TO REMEMBER °· Keep up with your usual exercises and follow other instructions given by your health care provider.   °· Take medicines as directed by your health care provider.   °· Keep your regular prenatal appointments.   °· Eat and drink lightly if you think you are going into labor.   °· If Braxton Hicks contractions are making you uncomfortable:   °¨ Change your position from lying down or resting to walking, or from walking to resting.   °¨ Sit and rest in a tub of warm water.   °¨ Drink 2-3 glasses of water. Dehydration may cause these contractions.   °¨ Do slow and deep breathing several times an hour.   °WHEN SHOULD I SEEK IMMEDIATE MEDICAL CARE? °Seek immediate medical care if: °· Your contractions become stronger, more regular, and closer together.   °· You have fluid leaking or gushing from your vagina.   °· You have a fever.   °· You pass blood-tinged mucus.   °· You have vaginal bleeding.   °· You have continuous abdominal pain.   °· You have low back pain that you never had before.   °· You feel your baby's head pushing down and causing pelvic pressure.   °· Your baby is not moving as much as it used to.   °  °This information is not intended to replace advice given to you by your health care provider. Make sure you discuss any questions you have with your health care   provider. °  °Document Released: 08/12/2005 Document Revised: 08/17/2013 Document Reviewed: 05/24/2013 °Elsevier Interactive Patient Education ©2016 Elsevier Inc. ° °

## 2016-05-29 ENCOUNTER — Ambulatory Visit (INDEPENDENT_AMBULATORY_CARE_PROVIDER_SITE_OTHER): Payer: Medicaid Other | Admitting: Family

## 2016-05-29 VITALS — BP 109/76 | HR 84 | Wt 179.0 lb

## 2016-05-29 DIAGNOSIS — Z3493 Encounter for supervision of normal pregnancy, unspecified, third trimester: Secondary | ICD-10-CM

## 2016-05-29 DIAGNOSIS — Z3483 Encounter for supervision of other normal pregnancy, third trimester: Secondary | ICD-10-CM

## 2016-05-29 DIAGNOSIS — Z3480 Encounter for supervision of other normal pregnancy, unspecified trimester: Secondary | ICD-10-CM

## 2016-05-29 DIAGNOSIS — O34219 Maternal care for unspecified type scar from previous cesarean delivery: Secondary | ICD-10-CM

## 2016-05-29 NOTE — Progress Notes (Signed)
   PRENATAL VISIT NOTE  Subjective:  Jacqueline Bowen is a 30 y.o. G3P1011 at 2232w2d being seen today for ongoing prenatal care.  She is currently monitored for the following issues for this low-risk pregnancy and has History of cesarean section complicating pregnancy and Supervision of normal pregnancy, antepartum on her problem list.  Patient reports no complaints.  Contractions: Irritability. Vag. Bleeding: None.  Movement: Present. Denies leaking of fluid.   The following portions of the patient's history were reviewed and updated as appropriate: allergies, current medications, past family history, past medical history, past social history, past surgical history and problem list. Problem list updated.  Objective:   Vitals:   05/29/16 0752  BP: 109/76  Pulse: 84  Weight: 179 lb (81.2 kg)    Fetal Status: Fetal Heart Rate (bpm): 145 Fundal Height: 37 cm Movement: Present  Presentation: Vertex  General:  Alert, oriented and cooperative. Patient is in no acute distress.  Skin: Skin is warm and dry. No rash noted.   Cardiovascular: Normal heart rate noted  Respiratory: Normal respiratory effort, no problems with respiration noted  Abdomen: Soft, gravid, appropriate for gestational age. Pain/Pressure: Present     Pelvic:  Cervical exam performed Dilation: Closed Effacement (%): 50    Extremities: Normal range of motion.  Edema: None  Mental Status: Normal mood and affect. Normal behavior. Normal judgment and thought content.   Urinalysis:      Assessment and Plan:  Pregnancy: G3P1011 at 5932w2d  1. Normal pregnancy, third trimester - Flu Vaccine QUAD 36+ mos IM (Fluarix, Quad PF) - Reviewed signs of labor - Monitor fetal movement  2. History of cesarean section complicating pregnancy - TOLAC consent on file - Discussed precautions/warning signs (bleeding, decreased fetal movement, constant abdominal pain)  Term labor symptoms and general obstetric precautions including but not  limited to vaginal bleeding, contractions, leaking of fluid and fetal movement were reviewed in detail with the patient. Please refer to After Visit Summary for other counseling recommendations.  Return in 1 week (on 06/05/2016) for appt and NST.  Eino FarberWalidah Kennith GainN Karim, CNM

## 2016-05-29 NOTE — Patient Instructions (Signed)

## 2016-06-04 ENCOUNTER — Inpatient Hospital Stay (HOSPITAL_COMMUNITY): Payer: Medicaid Other | Admitting: Anesthesiology

## 2016-06-04 ENCOUNTER — Inpatient Hospital Stay (HOSPITAL_COMMUNITY)
Admission: AD | Admit: 2016-06-04 | Discharge: 2016-06-07 | DRG: 775 | Disposition: A | Discharge status: Home / Self Care | Payer: Medicaid Other | Source: Ambulatory Visit | Attending: Obstetrics and Gynecology | Admitting: Obstetrics and Gynecology

## 2016-06-04 ENCOUNTER — Encounter (HOSPITAL_COMMUNITY): Payer: Self-pay | Admitting: *Deleted

## 2016-06-04 DIAGNOSIS — O4292 Full-term premature rupture of membranes, unspecified as to length of time between rupture and onset of labor: Principal | ICD-10-CM | POA: Diagnosis present

## 2016-06-04 DIAGNOSIS — O34211 Maternal care for low transverse scar from previous cesarean delivery: Secondary | ICD-10-CM | POA: Diagnosis present

## 2016-06-04 DIAGNOSIS — IMO0002 Reserved for concepts with insufficient information to code with codable children: Secondary | ICD-10-CM | POA: Diagnosis present

## 2016-06-04 DIAGNOSIS — Z3A40 40 weeks gestation of pregnancy: Secondary | ICD-10-CM

## 2016-06-04 DIAGNOSIS — O411230 Chorioamnionitis, third trimester, not applicable or unspecified: Secondary | ICD-10-CM | POA: Diagnosis present

## 2016-06-04 DIAGNOSIS — Z3480 Encounter for supervision of other normal pregnancy, unspecified trimester: Secondary | ICD-10-CM

## 2016-06-04 DIAGNOSIS — O34219 Maternal care for unspecified type scar from previous cesarean delivery: Secondary | ICD-10-CM | POA: Diagnosis not present

## 2016-06-04 LAB — CBC
HEMATOCRIT: 36.9 % (ref 36.0–46.0)
Hemoglobin: 12.7 g/dL (ref 12.0–15.0)
MCH: 27.7 pg (ref 26.0–34.0)
MCHC: 34.4 g/dL (ref 30.0–36.0)
MCV: 80.6 fL (ref 78.0–100.0)
PLATELETS: 193 10*3/uL (ref 150–400)
RBC: 4.58 MIL/uL (ref 3.87–5.11)
RDW: 13.9 % (ref 11.5–15.5)
WBC: 12.9 10*3/uL — ABNORMAL HIGH (ref 4.0–10.5)

## 2016-06-04 LAB — AMNISURE RUPTURE OF MEMBRANE (ROM) NOT AT ARMC: Amnisure ROM: POSITIVE

## 2016-06-04 MED ORDER — FENTANYL CITRATE (PF) 100 MCG/2ML IJ SOLN
100.0000 ug | Freq: Once | INTRAMUSCULAR | Status: AC
  Administered 2016-06-04: 100 ug via INTRAVENOUS
  Filled 2016-06-04: qty 2

## 2016-06-04 MED ORDER — LACTATED RINGERS IV SOLN
INTRAVENOUS | Status: DC
  Administered 2016-06-04 – 2016-06-05 (×5): via INTRAVENOUS

## 2016-06-04 MED ORDER — EPHEDRINE 5 MG/ML INJ: 10.0000 mg | INTRAVENOUS | Status: DC | PRN

## 2016-06-04 MED ORDER — SOD CITRATE-CITRIC ACID 500-334 MG/5ML PO SOLN
30.0000 mL | ORAL | Status: DC | PRN
  Filled 2016-06-04: qty 15

## 2016-06-04 MED ORDER — FENTANYL 2.5 MCG/ML BUPIVACAINE 1/10 % EPIDURAL INFUSION (WH - ANES)
14.0000 mL/h | INTRAMUSCULAR | Status: DC | PRN
  Administered 2016-06-04 – 2016-06-05 (×4): 14 mL/h via EPIDURAL
  Filled 2016-06-04 (×4): qty 125

## 2016-06-04 MED ORDER — ONDANSETRON HCL 4 MG/2ML IJ SOLN
4.0000 mg | Freq: Four times a day (QID) | INTRAMUSCULAR | Status: DC | PRN
  Administered 2016-06-05: 4 mg via INTRAVENOUS
  Filled 2016-06-04: qty 2

## 2016-06-04 MED ORDER — LIDOCAINE HCL (PF) 1 % IJ SOLN
30.0000 mL | INTRAMUSCULAR | Status: DC | PRN
Start: 2016-06-04 — End: 2016-06-05
  Filled 2016-06-04: qty 30

## 2016-06-04 MED ORDER — FLEET ENEMA 7-19 GM/118ML RE ENEM: 1.0000 | ENEMA | RECTAL | Status: DC | PRN

## 2016-06-04 MED ORDER — PHENYLEPHRINE 40 MCG/ML (10ML) SYRINGE FOR IV PUSH (FOR BLOOD PRESSURE SUPPORT): 80.0000 ug | PREFILLED_SYRINGE | INTRAVENOUS | Status: DC | PRN

## 2016-06-04 MED ORDER — OXYCODONE-ACETAMINOPHEN 5-325 MG PO TABS: 1.0000 | ORAL_TABLET | ORAL | Status: DC | PRN

## 2016-06-04 MED ORDER — DIPHENHYDRAMINE HCL 50 MG/ML IJ SOLN: 12.5000 mg | INTRAMUSCULAR | Status: DC | PRN

## 2016-06-04 MED ORDER — FENTANYL CITRATE (PF) 100 MCG/2ML IJ SOLN
100.0000 ug | INTRAMUSCULAR | Status: DC | PRN
  Administered 2016-06-04: 100 ug via INTRAVENOUS
  Filled 2016-06-04: qty 2

## 2016-06-04 MED ORDER — LIDOCAINE HCL (PF) 1 % IJ SOLN
INTRAMUSCULAR | Status: DC | PRN
  Administered 2016-06-04: 6 mL via EPIDURAL
  Administered 2016-06-04: 4 mL via EPIDURAL

## 2016-06-04 MED ORDER — ACETAMINOPHEN 325 MG PO TABS: 650.0000 mg | ORAL_TABLET | ORAL | Status: DC | PRN

## 2016-06-04 MED ORDER — OXYTOCIN 40 UNITS IN LACTATED RINGERS INFUSION - SIMPLE MED
1.0000 m[IU]/min | INTRAVENOUS | Status: DC
  Administered 2016-06-04: 2 m[IU]/min via INTRAVENOUS
  Filled 2016-06-04: qty 1000

## 2016-06-04 MED ORDER — DIPHENHYDRAMINE HCL 50 MG/ML IJ SOLN
12.5000 mg | INTRAMUSCULAR | Status: DC | PRN
Start: 2016-06-04 — End: 2016-06-05

## 2016-06-04 MED ORDER — LACTATED RINGERS IV SOLN
500.0000 mL | INTRAVENOUS | Status: DC | PRN
Start: 2016-06-04 — End: 2016-06-05
  Administered 2016-06-05: 1000 mL via INTRAVENOUS

## 2016-06-04 MED ORDER — OXYTOCIN BOLUS FROM INFUSION
500.0000 mL | Freq: Once | INTRAVENOUS | Status: AC
  Administered 2016-06-05: 500 mL via INTRAVENOUS

## 2016-06-04 MED ORDER — PHENYLEPHRINE 40 MCG/ML (10ML) SYRINGE FOR IV PUSH (FOR BLOOD PRESSURE SUPPORT)
80.0000 ug | PREFILLED_SYRINGE | INTRAVENOUS | Status: DC | PRN
  Filled 2016-06-04: qty 10

## 2016-06-04 MED ORDER — LACTATED RINGERS IV SOLN: 500.0000 mL | Freq: Once | INTRAVENOUS | Status: DC

## 2016-06-04 MED ORDER — OXYTOCIN 40 UNITS IN LACTATED RINGERS INFUSION - SIMPLE MED: 2.5000 [IU]/h | INTRAVENOUS | Status: DC

## 2016-06-04 MED ORDER — OXYCODONE-ACETAMINOPHEN 5-325 MG PO TABS: 2.0000 | ORAL_TABLET | ORAL | Status: DC | PRN

## 2016-06-04 MED ORDER — TERBUTALINE SULFATE 1 MG/ML IJ SOLN: 0.2500 mg | Freq: Once | INTRAMUSCULAR | Status: DC | PRN

## 2016-06-04 NOTE — Progress Notes (Signed)
LABOR PROGRESS NOTE  Jacqueline Bowen is a 30 y.o. G3P1011 at 7763w1d  admitted for SROM.  Subjective: Pt doing well. Resting comfortably with epidural. No concerns.  Objective: BP (!) 130/97   Pulse 87   Temp 97.3 F (36.3 C) (Oral)   Resp 16   Ht 5\' 6"  (1.676 m)   Wt 81.2 kg (179 lb)   LMP 08/28/2015 (Exact Date)   SpO2 99%   BMI 28.89 kg/m  or  Vitals:   06/04/16 2000 06/04/16 2001 06/04/16 2005 06/04/16 2006  BP: 113/63  (!) 130/97   Pulse: 91 92 93 87  Resp:      Temp:      TempSrc:      SpO2: 100%  99%   Weight:      Height:        Dilation: 2 Effacement (%): 60, 70 Cervical Position: Middle Station: -2 Presentation: Vertex Exam by:: Natalia LeatherwoodKatherine K CNM  Labs: Lab Results  Component Value Date   WBC 12.9 (H) 06/04/2016   HGB 12.7 06/04/2016   HCT 36.9 06/04/2016   MCV 80.6 06/04/2016   PLT 193 06/04/2016    Patient Active Problem List   Diagnosis Date Noted  . Rupture of membranes with clear amniotic fluid 06/04/2016  . Supervision of normal pregnancy, antepartum 12/20/2015  . History of cesarean section complicating pregnancy 11/22/2015    Assessment / Plan: 30 y.o. G3P1011 at 2463w1d here for SROM.  Labor: Foley placed at 2000. Contracting well. May need to do IUPC and Pit later tonight. Fetal Wellbeing:  Category I Pain Control:  Epidural Anticipated MOD:  TOLAC  Hilton SinclairKaty D Kunta Hilleary, MD 06/04/2016, 8:34 PM

## 2016-06-04 NOTE — MAU Note (Signed)
Contractions started around 0400 and have gotten stronger and closer together.  Denies LOF/VB.  Previous section but desires a TOLAC.

## 2016-06-04 NOTE — H&P (Signed)
LABOR AND DELIVERY ADMISSION HISTORY AND PHYSICAL NOTE  Jacqueline Bowen is a 30 y.o. female G3P1011 with IUP at [redacted]w[redacted]d by LMP presenting for SROM.  Started having contractions around 4am this morning. Has felt leakage of fluids but no gross rupture noticed. Some small amounts of blood noticed. She reports positive fetal movement. Prior c-section for decreased fetal heart tones w/ labor.   Prenatal History/Complications:  Past Medical History: Past Medical History:  Diagnosis Date  . Medical history non-contributory     Past Surgical History: Past Surgical History:  Procedure Laterality Date  . CESAREAN SECTION N/A 08/11/2013   Procedure: CESAREAN SECTION;  Surgeon: Jeani Hawking, MD;  Location: [facility];  Service: Obstetrics;  Laterality: N/A;    Obstetrical History: OB History    Gravida Para Term Preterm AB Living   3 1 1  0 1 1   SAB TAB Ectopic Multiple Live Births   1 0 0 0 1      Social History: Social History   Social History  . Marital status: Married    Spouse name: N/A  . Number of children: N/A  . Years of education: N/A   Social History Main Topics  . Smoking status: Never Smoker  . Smokeless tobacco: Never Used  . Alcohol use No  . Drug use: No  . Sexual activity: Yes     Comment: postpartum   Other Topics Concern  . None   Social History Narrative  . None    Family History: Family History  Problem Relation Age of Onset  . Heart attack Neg Hx     Allergies: No Known Allergies  Prescriptions Prior to Admission  Medication Sig Dispense Refill Last Dose  . Prenatal Vit-Fe Fumarate-FA (PRENATAL MULTIVITAMIN) TABS tablet Take 1 tablet by mouth daily at 12 noon.   06/03/2016 at Unknown time  . famotidine-calcium carbonate-magnesium hydroxide (PEPCID COMPLETE) 10-800-165 MG chewable tablet Chew 2 tablets by mouth 2 (two) times daily as needed. (Patient not taking: Reported on 06/04/2016) 60 tablet 1 Not Taking at Unknown time     Review  of Systems   All systems reviewed and negative except as stated in HPI  Blood pressure 110/64, pulse 107, temperature 97.5 F (36.4 C), temperature source Oral, resp. rate 16, last menstrual period 08/28/2015, unknown if currently breastfeeding. General appearance: alert, cooperative and no distress Lungs: no respiratory distress Heart: regular rate Abdomen: soft, non-tender Extremities: No calf swelling or tenderness Presentation: cephalic by nursing exam Fetal monitoring: 135 baseline, accelerations present, no decelerations Uterine activity: q2 minutes Dilation: 2 Effacement (%): 80 Station: -3, -2 Exam by:: Bobbe Medico, RNC   Prenatal labs: ABO, Rh: O/POS/-- (03/29 1024) Antibody: NEG (03/29 1024) Rubella: immune RPR: NON REAC (07/13 0924)  HBsAg: NEGATIVE (03/29 1024)  HIV: NONREACTIVE (07/13 0924)  GBS: Negative (09/06 0000)   Prenatal Transfer Tool  Maternal Diabetes: No Genetic Screening: Declined Maternal Ultrasounds/Referrals: Normal Fetal Ultrasounds or other Referrals:  Referred to Materal Fetal Medicine  Maternal Substance Abuse:  No Significant Maternal Medications:  None Significant Maternal Lab Results: None  Results for orders placed or performed during the hospital encounter of 06/04/16 (from the past 24 hour(s))  Amnisure rupture of membrane (rom)not at [facility]   Collection Time: 06/04/16 11:38 AM  Result Value Ref Range   Amnisure ROM POSITIVE     Patient Active Problem List   Diagnosis Date Noted  . Supervision of normal pregnancy, antepartum 12/20/2015  . History of cesarean section complicating pregnancy  11/22/2015    Assessment: Jacqueline Bowen is a 30 y.o. G3P1011 at 6024w1d here for SROM, desires TOLAC  #Labor: SROM #Pain: IV pain medicine, considering epidural #FWB: Category 1 tracing #ID:  GBS negative #MOF: breastfeeding #MOC: undecided #Circ:  n/a  Tillman SersAngela C Riccio, DO PGY-1 10/10/201712:27 PM   I have examined the patient  and agree with the above exam and documentation. Luna KitchensKAthryn Kooistra CNM

## 2016-06-04 NOTE — Progress Notes (Signed)
.   LABOR PROGRESS NOTE  Jacqueline Bowen is a 30 y.o. G3P1011 at 9071w1d  admitted for SROM.  Subjective: Pt doing well. Foley bulb just came out. Still comfortable with epidural.  Objective: BP 134/74   Pulse (!) 114   Temp 98.3 F (36.8 C) (Oral)   Resp 16   Ht 5\' 6"  (1.676 m)   Wt 81.2 kg (179 lb)   LMP 08/28/2015 (Exact Date)   SpO2 99%   BMI 28.89 kg/m  or  Vitals:   06/04/16 2130 06/04/16 2145 06/04/16 2159 06/04/16 2200  BP: 114/66  134/74 134/74  Pulse: 94  (!) 114 (!) 114  Resp:  16    Temp:  98.3 F (36.8 C)    TempSrc:  Oral    SpO2:      Weight:      Height:        Dilation: 5.5 Effacement (%): 80 Cervical Position: Middle Station: -2 Presentation: Vertex Exam by:: K Booker CNM  Labs: Lab Results  Component Value Date   WBC 12.9 (H) 06/04/2016   HGB 12.7 06/04/2016   HCT 36.9 06/04/2016   MCV 80.6 06/04/2016   PLT 193 06/04/2016    Patient Active Problem List   Diagnosis Date Noted  . Rupture of membranes with clear amniotic fluid 06/04/2016  . Supervision of normal pregnancy, antepartum 12/20/2015  . History of cesarean section complicating pregnancy 11/22/2015    Assessment / Plan: 30 y.o. G3P1011 at 671w1d here for SOL.  Labor: Foley bulb out 2130. AROM (light mec) 2130. IUPC placed. Will start Pit if contractions are inadequate. Fetal Wellbeing:  Category I Pain Control:  Epidural Anticipated MOD:  TOLAC  Hilton SinclairKaty D Mayo, MD 06/04/2016, 10:12 PM

## 2016-06-04 NOTE — Anesthesia Pain Management Evaluation Note (Signed)
  [facility] Pain Management Visit Note  Patient: Jacqueline Bowen, 30 y.o., female  "Hello I am a [facility] of the anesthesia [facility] at [facility]. We have an anesthesia team available at all times to provide care throughout the hospital, including epidural management and anesthesia for C-section. I don't know your plan for the delivery whether it a natural birth, water birth, IV sedation, nitrous supplementation, doula or epidural, but we want to meet your pain goals."   1.Was your pain managed to your expectations on prior hospitalizations?   Yes   2.What is your expectation for pain management during this hospitalization?     Epidural  3.How can we help you reach that goal?   Record the patient's initial score and the patient's pain goal.   Pain: 0  Pain Goal: 7 [facility] wants you to be able to say your pain was always managed very well.  Daric Koren Hristova 06/04/2016

## 2016-06-04 NOTE — Anesthesia Preprocedure Evaluation (Addendum)
Anesthesia Evaluation  Patient identified by MRN, date of birth, ID band Patient awake    Reviewed: Allergy & Precautions, NPO status , Patient's Chart, lab work & pertinent test results  History of Anesthesia Complications Negative for: history of anesthetic complications  Airway Mallampati: III  TM Distance: >3 FB Neck ROM: Full    Dental  (+) Teeth Intact   Pulmonary neg pulmonary ROS,    Pulmonary exam normal breath sounds clear to auscultation       Cardiovascular negative cardio ROS   Rhythm:Regular Rate:Normal     Neuro/Psych negative neurological ROS     GI/Hepatic negative GI ROS, Neg liver ROS,   Endo/Other  negative endocrine ROS  Renal/GU negative Renal ROS     Musculoskeletal   Abdominal   Peds  Hematology negative hematology ROS (+)   Anesthesia Other Findings H/o previous c-section on 08/11/2013  Reproductive/Obstetrics (+) Pregnancy                            Anesthesia Physical Anesthesia Plan  ASA: II  Anesthesia Plan: Epidural   Post-op Pain Management:    Induction:   Airway Management Planned: Natural Airway  Additional Equipment:   Intra-op Plan:   Post-operative Plan:   Informed Consent: I have reviewed the patients History and Physical, chart, labs and discussed the procedure including the risks, benefits and alternatives for the proposed anesthesia with the patient or authorized representative who has indicated his/her understanding and acceptance.     Plan Discussed with:   Anesthesia Plan Comments: (I have discussed risks of neuraxial anesthesia including but not limited to infection, bleeding, nerve injury, back pain, headache, seizures, and failure of block. Patient denies bleeding disorders and is not currently anticoagulated. Labs have been reviewed. Risks and benefits discussed. All patient's questions answered.   Platelets 193)         Anesthesia Quick Evaluation

## 2016-06-04 NOTE — Progress Notes (Signed)
Patient resting comfortably in bed with epidural in place. FB placed at 1950 2cm/50/-2.  FHR CAT 1. VS stable, afebrile. Patient desires to continue with TOLAC. Anticipate NSVD.  Luna KitchensKathryn Kooistra, CNM

## 2016-06-04 NOTE — Anesthesia Procedure Notes (Signed)
Epidural Patient location during procedure: OB Start time: 06/04/2016 5:23 PM End time: 06/04/2016 5:29 PM  Staffing Anesthesiologist: Linton RumpALLAN, Melena Hayes DICKERSON Performed: anesthesiologist   Preanesthetic Checklist Completed: patient identified, surgical consent, pre-op evaluation, timeout performed, IV checked, risks and benefits discussed and monitors and equipment checked  Epidural Patient position: sitting Prep: site prepped and draped and DuraPrep Patient monitoring: continuous pulse ox and blood pressure Approach: midline Location: L2-L3 Injection technique: LOR air  Needle:  Needle type: Tuohy  Needle gauge: 17 G Needle length: 9 cm and 9 Needle insertion depth: 5 cm cm Catheter type: closed end flexible Catheter size: 19 Gauge Catheter at skin depth: 10 cm Test dose: negative  Assessment Events: blood not aspirated, injection not painful, no injection resistance, negative IV test and no paresthesia  Additional Notes Epidural placed easily on first attempt.Reason for block:procedure for pain

## 2016-06-05 ENCOUNTER — Encounter (HOSPITAL_COMMUNITY): Payer: Self-pay

## 2016-06-05 DIAGNOSIS — Z3A40 40 weeks gestation of pregnancy: Secondary | ICD-10-CM

## 2016-06-05 LAB — RPR: RPR: NONREACTIVE

## 2016-06-05 LAB — ABO/RH: ABO/RH(D): O POS

## 2016-06-05 LAB — PREPARE RBC (CROSSMATCH)

## 2016-06-05 LAB — HIV ANTIBODY (ROUTINE TESTING W REFLEX): HIV Screen 4th Generation wRfx: NONREACTIVE

## 2016-06-05 MED ORDER — COCONUT OIL OIL
1.0000 "application " | TOPICAL_OIL | Status: DC | PRN
  Administered 2016-06-05: 1 via TOPICAL
  Filled 2016-06-05: qty 120

## 2016-06-05 MED ORDER — DIBUCAINE 1 % RE OINT
1.0000 "application " | TOPICAL_OINTMENT | RECTAL | Status: DC | PRN
  Administered 2016-06-05: 1 via RECTAL
  Filled 2016-06-05: qty 28

## 2016-06-05 MED ORDER — SODIUM CHLORIDE 0.9 % IV SOLN: Freq: Once | INTRAVENOUS | Status: DC

## 2016-06-05 MED ORDER — ACETAMINOPHEN 500 MG PO TABS
1000.0000 mg | ORAL_TABLET | Freq: Once | ORAL | Status: AC
  Administered 2016-06-05: 1000 mg via ORAL
  Filled 2016-06-05: qty 2

## 2016-06-05 MED ORDER — ZOLPIDEM TARTRATE 5 MG PO TABS: 5.0000 mg | ORAL_TABLET | Freq: Every evening | ORAL | Status: DC | PRN

## 2016-06-05 MED ORDER — SIMETHICONE 80 MG PO CHEW
80.0000 mg | CHEWABLE_TABLET | ORAL | Status: DC | PRN
  Administered 2016-06-05: 80 mg via ORAL
  Filled 2016-06-05: qty 1

## 2016-06-05 MED ORDER — GENTAMICIN SULFATE 40 MG/ML IJ SOLN
170.0000 mg | Freq: Three times a day (TID) | INTRAVENOUS | Status: DC
  Administered 2016-06-05: 170 mg via INTRAVENOUS
  Filled 2016-06-05 (×2): qty 4.25

## 2016-06-05 MED ORDER — BUPIVACAINE HCL (PF) 0.25 % IJ SOLN
INTRAMUSCULAR | Status: DC | PRN
  Administered 2016-06-05: 8 mL via EPIDURAL

## 2016-06-05 MED ORDER — ONDANSETRON HCL 4 MG PO TABS: 4.0000 mg | ORAL_TABLET | ORAL | Status: DC | PRN

## 2016-06-05 MED ORDER — ACETAMINOPHEN 325 MG PO TABS
650.0000 mg | ORAL_TABLET | ORAL | Status: DC | PRN
  Administered 2016-06-07: 650 mg via ORAL
  Filled 2016-06-05 (×3): qty 2

## 2016-06-05 MED ORDER — IBUPROFEN 600 MG PO TABS
600.0000 mg | ORAL_TABLET | Freq: Four times a day (QID) | ORAL | Status: DC
  Administered 2016-06-05 – 2016-06-07 (×6): 600 mg via ORAL
  Filled 2016-06-05 (×6): qty 1

## 2016-06-05 MED ORDER — ONDANSETRON HCL 4 MG/2ML IJ SOLN: 4.0000 mg | INTRAMUSCULAR | Status: DC | PRN

## 2016-06-05 MED ORDER — WITCH HAZEL-GLYCERIN EX PADS
1.0000 "application " | MEDICATED_PAD | CUTANEOUS | Status: DC | PRN
  Administered 2016-06-05: 1 via TOPICAL

## 2016-06-05 MED ORDER — DIPHENHYDRAMINE HCL 25 MG PO CAPS
25.0000 mg | ORAL_CAPSULE | Freq: Four times a day (QID) | ORAL | Status: DC | PRN
Start: 2016-06-05 — End: 2016-06-07

## 2016-06-05 MED ORDER — SODIUM CHLORIDE 0.9 % IV SOLN
2.0000 g | Freq: Four times a day (QID) | INTRAVENOUS | Status: DC
  Administered 2016-06-05: 2 g via INTRAVENOUS
  Filled 2016-06-05 (×2): qty 2000

## 2016-06-05 MED ORDER — SENNOSIDES-DOCUSATE SODIUM 8.6-50 MG PO TABS
2.0000 | ORAL_TABLET | ORAL | Status: DC
  Administered 2016-06-05: 2 via ORAL
  Filled 2016-06-05 (×2): qty 2

## 2016-06-05 MED ORDER — TETANUS-DIPHTH-ACELL PERTUSSIS 5-2.5-18.5 LF-MCG/0.5 IM SUSP: 0.5000 mL | Freq: Once | INTRAMUSCULAR | Status: DC

## 2016-06-05 MED ORDER — BENZOCAINE-MENTHOL 20-0.5 % EX AERO: 1.0000 "application " | INHALATION_SPRAY | CUTANEOUS | Status: DC | PRN

## 2016-06-05 MED ORDER — PRENATAL MULTIVITAMIN CH
1.0000 | ORAL_TABLET | Freq: Every day | ORAL | Status: DC
  Administered 2016-06-06: 1 via ORAL
  Filled 2016-06-05: qty 1

## 2016-06-05 NOTE — Progress Notes (Signed)
Called due to recurrent variable declarations. Strip reviewed. Will start amnioinfusion. If does not improve quickly would consider halving the pitocin. Continue ot monitor closely.

## 2016-06-05 NOTE — Progress Notes (Signed)
Neonatology Note:   Attendance at Delivery:    I was asked by Dr. Emelda FearFerguson to attend this NSVD at term due to maternal fever, fetal tachycardia, and TOLAC. The mother is a G3P1A1 O pos, GBS neg with TOLAC. ROM 23 hours before delivery, fluid with meconium. Mother developed fever to 101.1 degrees and was treated with one dose each of Ampicillin and Gentamicin about 3 hours before delivery. There was some fetal tachycardia, but no other distress. Controlled vaginal delivery, infant blue and floopy, with minimal respiratory effort at birth. I bulb suctioned her while the cord was being cut and got large amounts of very thick mucous, light gree in color, from both nares and the mouth. After the baby was brought to the warmer, we did more suctioning and gave stimulation to encourage crying. We placed a pulse oximeter and the O2 sat was 45% at 3 minutes of age, so we started BBO2. We performed chest PT and did DeLee suctioning once, but the catheter clogged, so we just used bulb suction, getting thick clear to light green mucous out. By 10 minutes, she was off supplemental O2 and maintaining O2 saturations in the 90-92% range. Lungs were clear with good air exchange, and tone was normal, color pink, and perfusion good. Ap 5/8/9. I asked the baby's nurse to keep the pulse oximeter on for another 10 minutes as the baby went skin to skin, then it can be discontinued if normal. To CN to care of Pediatrician.  [facility] early onset sepsis calculator places the risk for this infant to be 0.26/1000 if the infant is well-appearing (after initial stabilization), which she is at this time. However, the risk rises if there are persistent physiologic abnormalities; if this occurs, the baby would require treatment with IV antibiotics.   Doretha Souhristie C. Ajayla Iglesias, MD

## 2016-06-05 NOTE — Progress Notes (Signed)
Pt seen and evaluated. Pt has been monitored throughout the day and has not changed her cervix since 6AM. She has not had adequate contractions until approximately 1PM. Will recheck between 4PM and 5PM if no change will consider c-section for arrest of decent at that time. Pt has had interment variables though the day and currently has a category 2 strip. Repositioning now.

## 2016-06-05 NOTE — Progress Notes (Signed)
Ok to leave pitocin at 10mu at this time, MD's will continue to monitor fhr tracing.

## 2016-06-05 NOTE — Progress Notes (Signed)
Pt seen and evaluated. Pt with variables on fetal monitoring. Despite amnioinfusion. Will half the pitocin at this time. Pt has made cervical change with cervix going to 6.5 cm and 0 station. Will continue to plan for SVD, but have discussed with pt the possibility of needing a c-section.

## 2016-06-05 NOTE — Progress Notes (Signed)
Jacqueline Bowen is a 30 y.o. G3P1011 at 9345w2d  admitted for rupture of membranes, and has been in labor throughout the day as TOLAC.  Over the past 2 hours she has developed fever to 101 axillary, with fetal HR over 160, maternal HR 130.  Dx of Chorioamnionitis made and Ampicillin/Gentamycin ordered.  Pt has developed some intermittent inspiratory chest pain, moderate, and previously received an epidural bolus and it went away. Will monitor closely    Subjective:   Objective: BP 122/80   Pulse (!) 117   Temp (!) 100.5 F (38.1 C) (Axillary)   Resp 20   Ht 5\' 6"  (1.676 m)   Wt 179 lb (81.2 kg)   LMP 08/28/2015 (Exact Date)   SpO2 100%   BMI 28.89 kg/m  I/O last 3 completed shifts: In: -  Out: 700 [Urine:700] No intake/output data recorded.  FHT:  FHR: 170 bpm, variability: moderate,  accelerations:  Present,  decelerations:  Present variables, cat II UC:   regular, every 3 minutes SVE:   Dilation: 7.5 Effacement (%): 80 Station: 0 Exam by:: Dr. Emelda FearFerguson this exam at 5:30 pm, and bladder foley bulb which was below the vertex was able to be moved up above the vertex with notable descent of the vertex with the next contraction.jvf  Repeat exam at 6:00 pm shows cervix 9+ cm,100% / +1 station with contraction.jvf     Labs: Lab Results  Component Value Date   WBC 12.9 (H) 06/04/2016   HGB 12.7 06/04/2016   HCT 36.9 06/04/2016   MCV 80.6 06/04/2016   PLT 193 06/04/2016    Assessment / Plan: TOLAC, chorioamnionitis, near delivery,  Shoulder pain, raising concerns for phrenic nerve irritation Labor: on pitocin Preeclampsia:   Fetal Wellbeing:  Category II Pain Control:  Epidural I/D:  n/a Anticipated MOD:  NSVD Crossmatch ordered.  Close monitoring. Keona Bilyeu V 06/05/2016, 6:07 PM

## 2016-06-05 NOTE — Progress Notes (Signed)
Pharmacy Antibiotic Note  Jacqueline Bowen is a 30 y.o. female admitted on 06/04/2016 for SROM and TOLAC.  Pharmacy has been consulted for Gentamicin dosing to r/o chorioamnionitis due to maternal temp during labor.  Plan: 1. Gentamicin 170mg  IV q8h. 2. Will continue to follow and assess need for SCr and further kinetic workup  Height: 5\' 6"  (167.6 cm) Weight: 179 lb (81.2 kg) IBW/kg (Calculated) : 59.3  Adjusted Dosing weight: 65.9kg  Temp (24hrs), Avg:98.5 F (36.9 C), Min:97.3 F (36.3 C), Max:100.5 F (38.1 C)   Recent Labs Lab 06/04/16 1252  WBC 12.9*    CrCl cannot be calculated (Patient's most recent lab result is older than the maximum 21 days allowed.).    No Known Allergies  Antimicrobials this admission: Ampicillin 2 gram IV q6h  10/11 >>   Dose adjustments this admission:   Microbiology results: *  Thank you for allowing pharmacy to be a part of this patient's care.  Jacqueline Bowen, Jacqueline Bowen 06/05/2016 5:18 PM

## 2016-06-06 ENCOUNTER — Other Ambulatory Visit: Payer: Self-pay | Admitting: Advanced Practice Midwife

## 2016-06-06 LAB — CBC
HCT: 35.2 % — ABNORMAL LOW (ref 36.0–46.0)
Hemoglobin: 12.2 g/dL (ref 12.0–15.0)
MCH: 27.9 pg (ref 26.0–34.0)
MCHC: 34.7 g/dL (ref 30.0–36.0)
MCV: 80.4 fL (ref 78.0–100.0)
PLATELETS: 156 10*3/uL (ref 150–400)
RBC: 4.38 MIL/uL (ref 3.87–5.11)
RDW: 14.3 % (ref 11.5–15.5)
WBC: 16.8 10*3/uL — ABNORMAL HIGH (ref 4.0–10.5)

## 2016-06-06 NOTE — Lactation Note (Signed)
This note was copied from a baby's chart. Lactation Consultation Note: Initial visit with this experienced BF mom. She reports that baby fed about 1 hour ago and is asleep skin to skin with mom. Reports no pain with latch. BF brochure given. Reviewed our phone number, OP appointments and BFSG as resources for support after DC. No questions at present. To call for assist prn  Patient Name: Jacqueline Bowen BrashJoyceline Gertner ZOXWR'UToday's Date: 06/06/2016 Reason for consult: Initial assessment   Maternal Data Formula Feeding for Exclusion: No Has patient been taught Hand Expression?: Yes Does the patient have breastfeeding experience prior to this delivery?: Yes  Feeding Feeding Type: Breast Fed Length of feed: 22 min  LATCH Score/Interventions Latch: Grasps breast easily, tongue down, lips flanged, rhythmical sucking. Intervention(s): Assist with latch;Breast compression;Breast massage  Audible Swallowing: A few with stimulation Intervention(s): Hand expression  Type of Nipple: Everted at rest and after stimulation  Comfort (Breast/Nipple): Soft / non-tender     Hold (Positioning): Assistance needed to correctly position infant at breast and maintain latch.  LATCH Score: 8  Lactation Tools Discussed/Used     Consult Status Consult Status: Follow-up Date: 06/07/16 Follow-up type: In-patient    Pamelia HoitWeeks, Lotus Santillo D 06/06/2016, 10:58 AM

## 2016-06-06 NOTE — Progress Notes (Signed)
Patient ID: Jacqueline Bowen, female   DOB: 1986/07/11, 30 y.o.   MRN: [id number]  POSTPARTUM PROGRESS NOTE  Post Partum Day 1  Subjective:  Jacqueline Bowen is a 30 y.o. [id number] 16110w2d s/p VBAC yesterday evening.  No acute events overnight.  Pt denies problems with ambulating, voiding or po intake.  She denies nausea or vomiting.  Pain is well controlled.  She has had flatus. She has not had bowel movement.  Lochia Small.   Objective: Blood pressure 106/73, pulse 77, temperature 97.8 F (36.6 C), temperature source Oral, resp. rate 18, height 5\' 6"  (1.676 m), weight 81.2 kg (179 lb), last menstrual period 08/28/2015, SpO2 100 %, unknown if currently breastfeeding.  Physical Exam:  General: alert, cooperative and no distress Lochia:normal flow Chest: CTAB Heart: RRR no m/r/g Abdomen: +BS, soft, nontender,  Uterine Fundus: firm, below level of umbilicus DVT Evaluation: No calf swelling or tenderness Extremities: without edema   Recent Labs  06/04/16 1252 06/06/16 0535  HGB 12.7 12.2  HCT 36.9 35.2*    Assessment/Plan:  ASSESSMENT: Jacqueline Bowen is a 30 y.o. [id number] 72110w2d s/p VBAC  Plan for discharge tomorrow   LOS: 2 days   Tillman Sersngela C Riccio, DO 06/06/2016, 7:49 AM   CNM attestation Post Partum Day #1 I have seen and examined this patient and agree with above documentation in the resident's note.   Jacqueline Bowen is a 30 y.o. [id number] s/p VBAC.  Pt denies problems with ambulating, voiding or po intake. Pain is well controlled.  Plan for birth control is LAM.  Method of Feeding: breast  PE:  BP 106/73 (BP Location: Right Arm)   Pulse 77   Temp 97.8 F (36.6 C) (Oral)   Resp 18   Ht 5\' 6"  (1.676 m)   Wt 81.2 kg (179 lb)   LMP 08/28/2015 (Exact Date)   SpO2 100%   Breastfeeding? Unknown   BMI 28.89 kg/m  Fundus firm  Plan for discharge: 06/07/16  Jacqueline Bowen, Jacqueline Bowen, CNM 9:45 AM  06/06/2016

## 2016-06-06 NOTE — Progress Notes (Signed)
Tenderness/pain reported by patient with fundal palpation [facility] not reported by [facility]&D RN, ? new finding).  Pt VSS, denies dizziness/lightheadedness/palp/pain at rest; pt also continues to c/o bilat shoulder pain which worsens when lying flat, pain decreases with HOB elevated.  Heating pack admin with +effect, mylicon admin.  Pt denies headache.  ? Atypical spinal HA vs. gas pains.  Plan to continue to monitor; notified Philipp DeputyKim Shaw CNM.

## 2016-06-06 NOTE — Anesthesia Postprocedure Evaluation (Signed)
Anesthesia Post Note  Patient: Jacqueline Bowen  Procedure(s) Performed: * No procedures listed *  Patient location during evaluation: Mother Baby Anesthesia Type: Epidural Level of consciousness: awake and alert Pain management: pain level controlled Vital Signs Assessment: post-procedure vital signs reviewed and stable Respiratory status: spontaneous breathing, nonlabored ventilation and respiratory function stable Cardiovascular status: stable Postop Assessment: no headache, no backache and epidural receding Anesthetic complications: no     Last Vitals:  Vitals:   06/05/16 2340 06/06/16 0250  BP: 121/81 106/73  Pulse: 83 77  Resp: 18 18  Temp: 36.7 C 36.6 C    Last Pain:  Vitals:   06/06/16 0515  TempSrc:   PainSc: 5    Pain Goal: Patients Stated Pain Goal: 2 (06/06/16 0515)               Junious SilkGILBERT,Vieno Tarrant

## 2016-06-06 NOTE — Progress Notes (Signed)
UR chart review completed.  

## 2016-06-07 DIAGNOSIS — O34219 Maternal care for unspecified type scar from previous cesarean delivery: Secondary | ICD-10-CM | POA: Diagnosis not present

## 2016-06-07 MED ORDER — IBUPROFEN 600 MG PO TABS
600.0000 mg | ORAL_TABLET | Freq: Four times a day (QID) | ORAL | 0 refills | Status: DC
Start: 2016-06-07 — End: 2018-02-10

## 2016-06-07 NOTE — Lactation Note (Signed)
This note was copied from a baby's chart. Lactation Consultation Note  Patient Name: Girl Marcia BrashJoyceline Maestas [id number]'s Date: 06/07/2016 Reason for consult: Follow-up assessment;Other (Comment) (5 % weight loss, )  Baby is 37 hours old. According to mom and the doc flow sheets has been consistent at the  Breast. Per mom feels comfortable with hand expressing. Also has a Manual and DEBP Medela at home. Sore nipple and engorgement prevention and tx reviewed. Mom has coconut oil at bedside . LC assessed breast tissue And the nipples appeared healthy, no breakdown. LC also referred to the Baby  and me booklet for BF resources. Mother informed of post-discharge support and given phone number to the lactation department, including services for phone  call assistance; out-patient appointments; and breastfeeding support group. List of other breastfeeding resources in the community  given in the handout. Encouraged mother to call for problems or concerns related to breastfeeding.   Maternal Data    Feeding Feeding Type: Breast Fed Length of feed: 25 min  LATCH Score/Interventions                      Lactation Tools Discussed/Used     Consult Status Consult Status: Complete Date: 06/07/16 Follow-up type: In-patient    Matilde SprangMargaret Ann Marquiz Sotelo 06/07/2016, 9:25 AM

## 2016-06-07 NOTE — Discharge Summary (Signed)
OB Discharge Summary     Patient Name: Jacqueline Bowen DOB: [birth year] MRN: [id number]  Date of admission: 06/04/2016 Delivering MD: Ernestina PennaSCHENK, NICHOLAS MICHAEL   Date of discharge: 06/07/2016  Admitting diagnosis: LABOR Intrauterine pregnancy: 5352w2d     Secondary diagnosis:  Principal Problem:   VBAC (vaginal birth after Cesarean)  Additional problems: Triple I     Discharge diagnosis: Term Pregnancy Delivered, VBAC and Triple I                                                                                                Post partum procedures:rhogam  Augmentation: AROM, Pitocin and Foley Balloon  Complications: Intrauterine Inflammation or infection (Chorioamniotis)  Hospital course:  PROM, augmentation of labor With Vaginal Delivery     30 y.o. yo [id number] at 7952w2d was admitted in Latent Labor w/ PROM on 06/04/2016. Patient was augmented w/ Foley bulb, AROM fore bag and pitocin and had an uncomplicated labor course as follows:  Membrane Rupture Time/Date: 4:00 AM,06/04/2016   Intrapartum Procedures: Episiotomy: None [1]                                         Lacerations:  None [1]  Patient had a delivery of a Viable infant. 06/05/2016  Information for the patient's newborn:  Lidia CollumKyereko, Girl Joylyn [[id number]][[id number]]  Delivery Method: Vag-Spont    [name] had an uncomplicated postpartum course.  She is ambulating, tolerating a regular diet, passing flatus, and urinating well. Patient is discharged home in stable condition on 06/07/16.    Physical exam Vitals:   06/06/16 0250 06/06/16 1000 06/06/16 1832 06/07/16 0600  BP: 106/73 104/68 117/61 117/62  Pulse: 77 83 79 80  Resp: 18 18 18 18   Temp: 97.8 F (36.6 C) 98.2 F (36.8 C) 98 F (36.7 C) 98.2 F (36.8 C)  TempSrc: Oral Oral Oral Oral  SpO2: 100% 100%    Weight:      Height:       General: alert, cooperative and no distress Lochia: appropriate Uterine Fundus: firm Incision: N/A DVT Evaluation: Negative  Homan's sign. Labs: Lab Results  Component Value Date   WBC 16.8 (H) 06/06/2016   HGB 12.2 06/06/2016   HCT 35.2 (L) 06/06/2016   MCV 80.4 06/06/2016   PLT 156 06/06/2016   CMP Latest Ref Rng & Units 08/05/2013  Glucose 70 - 99 mg/dL 88  BUN 6 - 23 mg/dL 4(L)  Creatinine 1.320.50 - 1.10 mg/dL 4.400.52  Sodium 102135 - 725145 mEq/L 135  Potassium 3.5 - 5.1 mEq/L 3.9  Chloride 96 - 112 mEq/L 101  CO2 19 - 32 mEq/L 25  Calcium 8.4 - 10.5 mg/dL 9.4  Total Protein 6.0 - 8.3 g/dL 7.4  Total Bilirubin 0.3 - 1.2 mg/dL 0.3  Alkaline Phos 39 - 117 U/L 148(H)  AST 0 - 37 U/L 34  ALT 0 - 35 U/L 20    Discharge instruction: per After Visit Summary and "Baby and Me Booklet".  After visit meds:  Medication List    STOP taking these medications   famotidine-calcium carbonate-magnesium hydroxide 10-800-165 MG chewable tablet Commonly known as:  PEPCID COMPLETE     TAKE these medications   ibuprofen 600 MG tablet Commonly known as:  ADVIL,MOTRIN Take 1 tablet (600 mg total) by mouth every 6 (six) hours.   prenatal multivitamin Tabs tablet Take 1 tablet by mouth daily at 12 noon.       Diet: routine diet  Activity: Advance as tolerated. Pelvic rest for 6 weeks.   Outpatient follow up:6 weeks Follow up Appt:Future Appointments Date Time Provider Department Center  07/15/2016 2:40 PM Judeth Horn, NP [facility]   Follow up Visit: Follow-up Information    [facility] up in 6 week(s).   Specialty:  Obstetrics and Gynecology Why:  Postpartum visit Contact information: [address] [place] [address] [phone]       [facility] MATERNITY ADMISSIONS .   Why:  as needed in emergencies Contact information: [address] [id number] mc [place] [address] [phone]           Postpartum contraception: Natural Family Planning  Newborn Data: Live born female  Birth Weight:  7 lb 10.9 oz (3485 g) APGAR: 5, 8  Baby Feeding: Breast Disposition:home with mother   06/07/2016 Dorathy Kinsman, CNM

## 2016-06-07 NOTE — Discharge Instructions (Signed)
Vaginal Delivery, Care After °Refer to this sheet in the next few weeks. These discharge instructions provide you with information on caring for yourself after delivery. Your health care provider may also give you specific instructions. Your treatment has been planned according to the most current medical practices available, but problems sometimes occur. Call your health care provider if you have any problems or questions after you go home. °HOME CARE INSTRUCTIONS °· Take over-the-counter or prescription medicines only as directed by your health care provider or pharmacist. °· Do not drink alcohol, especially if you are breastfeeding or taking medicine to relieve pain. °· Do not chew or smoke tobacco. °· Do not use illegal drugs. °· Continue to use good perineal care. Good perineal care includes: °¨ Wiping your perineum from front to back. °¨ Keeping your perineum clean. °· Do not use tampons or douche until your health care provider says it is okay. °· Shower, wash your hair, and take tub baths as directed by your health care provider. °· Wear a well-fitting bra that provides breast support. °· Eat healthy foods. °· Drink enough fluids to keep your urine clear or pale yellow. °· Eat high-fiber foods such as whole grain cereals and breads, brown rice, beans, and fresh fruits and vegetables every day. These foods may help prevent or relieve constipation. °· Follow your health care provider's recommendations regarding resumption of activities such as climbing stairs, driving, lifting, exercising, or traveling. °· Talk to your health care provider about resuming sexual activities. Resumption of sexual activities is dependent upon your risk of infection, your rate of healing, and your comfort and desire to resume sexual activity. °· Try to have someone help you with your household activities and your newborn for at least a few days after you leave the hospital. °· Rest as much as possible. Try to rest or take a nap  when your newborn is sleeping. °· Increase your activities gradually. °· Keep all of your scheduled postpartum appointments. It is very important to keep your scheduled follow-up appointments. At these appointments, your health care provider will be checking to make sure that you are healing physically and emotionally. °SEEK MEDICAL CARE IF:  °· You are passing large clots from your vagina. Save any clots to show your health care provider. °· You have a foul smelling discharge from your vagina. °· You have trouble urinating. °· You are urinating frequently. °· You have pain when you urinate. °· You have a change in your bowel movements. °· You have increasing redness, pain, or swelling near your vaginal incision (episiotomy) or vaginal tear. °· You have pus draining from your episiotomy or vaginal tear. °· Your episiotomy or vaginal tear is separating. °· You have painful, hard, or reddened breasts. °· You have a severe headache. °· You have blurred vision or see spots. °· You feel sad or depressed. °· You have thoughts of hurting yourself or your newborn. °· You have questions about your care, the care of your newborn, or medicines. °· You are dizzy or light-headed. °· You have a rash. °· You have nausea or vomiting. °· You were breastfeeding and have not had a menstrual period within 12 weeks after you stopped breastfeeding. °· You are not breastfeeding and have not had a menstrual period by the 12th week after delivery. °· You have a fever. °SEEK IMMEDIATE MEDICAL CARE IF:  °· You have persistent pain. °· You have chest pain. °· You have shortness of breath. °· You faint. °· You   have leg pain. °· You have stomach pain. °· Your vaginal bleeding saturates two or more sanitary pads in 1 hour. °  °This information is not intended to replace advice given to you by your health care provider. Make sure you discuss any questions you have with your health care provider. °  °Document Released: 08/09/2000 Document Revised:  05/03/2015 Document Reviewed: 04/08/2012 °[employer]vier Interactive Patient Education ©2016 [employer] ° °Breastfeeding Challenges and Solutions °Even though breastfeeding is natural, it can be challenging, especially in the first few weeks after childbirth. It is normal for problems to arise when starting to breastfeed your new baby, even if you have breastfed before. This document provides some solutions to the most common breastfeeding challenges.  °CHALLENGES AND SOLUTIONS °Challenge--Cracked or Sore Nipples °Cracked or sore nipples are commonly experienced by breastfeeding mothers. Cracked or sore nipples often are caused by inadequate latching (when your baby's mouth attaches to your breast to breastfeed). Soreness can also happen if your baby is not positioned properly at your breast. Although nipple cracking and soreness are common during the first week after birth, nipple pain is never normal. If you experience nipple cracking or soreness that lasts longer than 1 week or nipple pain, call your health care provider or lactation consultant.  °Solution °Ensure proper latching and positioning of your baby by following the steps below: °· Find a comfortable place to sit or lie down, with your neck and back well supported. °· Place a pillow or rolled up blanket under your baby to bring him or her to the level of your breast (if you are seated). °· Make sure that your baby's abdomen is facing your abdomen. °· Gently massage your breast. With your fingertips, massage from your chest wall toward your nipple in a circular motion. This encourages milk flow. You may need to continue this action during the feeding if your milk flows slowly. °· Support your breast with 4 fingers underneath and your thumb above your nipple. Make sure your fingers are well away from your nipple and your baby's mouth. °· Stroke your baby's lips gently with your finger or nipple. °· When your baby's mouth is open wide enough, quickly bring  your baby to your breast, placing your entire nipple and as much of the colored area around your nipple (areola) as possible into your baby's mouth. °¨ More areola should be visible above your baby's upper lip than below the lower lip. °¨ Your baby's tongue should be between his or her lower gum and your breast. °· Ensure that your baby's mouth is correctly positioned around your nipple (latched). Your baby's lips should create a seal on your breast and be turned out (everted). °· It is common for your baby to suck for about 2-3 minutes in order to start the flow of breast milk. °Signs that your baby has successfully latched on to your nipple include:  °· Quietly tugging or quietly sucking without causing you pain.   °· Swallowing heard between every 3-4 sucks.   °· Muscle movement above and in front of his or her ears with sucking.   °Signs that your baby has not successfully latched on to nipple include:  °· Sucking sounds or smacking sounds from your baby while nursing.   °· Nipple pain.   °Ensure that your breasts stay moisturized and healthy by: °· Avoiding the use of soap on your nipples.   °· Wearing a supportive bra. Avoid wearing underwire-style bras or tight bras.   °· Air drying your nipples for 3-4 minutes   after each feeding.   °· Using only cotton bra pads to absorb breast milk leakage. Leaking of breast milk between feedings is normal.  Be sure to change the pads if they become soaked with milk. °· Using lanolin on your nipples after nursing. Lanolin helps to maintain your skin's normal moisture barrier. If you use pure lanolin you do not need to wash it off before feeding your baby again. Pure lanolin is not toxic to your baby.  You may also hand express a few drops of breast milk and gently massage that milk into your nipples, allowing it to air dry. °Challenge--Breast Engorgement °Breast engorgement is the overfilling of your breasts with breast milk. In the first few weeks after giving birth, you  may experience breast engorgement. Breast engorgement can make your breasts throb and feel hard, tightly stretched, warm, and tender. Engorgement peaks about the fifth day after you give birth. Having breast engorgement does not mean you have to stop breastfeeding your baby. °Solution °· Breastfeed when you feel the need to reduce the fullness of your breasts or when your baby shows signs of hunger. This is called "breastfeeding on demand." °· Newborns (babies younger than 4 weeks) often breastfeed every 1-3 hours during the day. You may need to awaken your baby to feed if he or she is asleep at a feeding time. °· Do not allow your baby to sleep longer than 5 hours during the night without a feeding. °· Pump or hand express breast milk before breastfeeding to soften your breast, areola, and nipple. °· Apply warm, moist heat (in the shower or with warm water-soaked hand towels) just before feeding or pumping, or massage your breast before or during breastfeeding. This increases circulation and helps your milk to flow. °· Completely empty your breasts when breastfeeding or pumping. Afterward, wear a snug bra (nursing or regular) or tank top for 1-2 days to signal your body to slightly decrease milk production. Only wear snug bras or tank tops to treat engorgement. Tight bras typically should be avoided by breastfeeding mothers. Once engorgement is relieved, return to wearing regular, loose-fitting clothes. °· Apply ice packs to your breasts to lessen the pain from engorgement and relieve swelling, unless the ice is uncomfortable for you. °· Do not delay feedings. Try to relax when it is time to feed your baby. This helps to trigger your "let-down reflex," which releases milk from your breast. °· Ensure your baby is latched on to your breast and positioned properly while breastfeeding. °· Allow your baby to remain at your breast as long as he or she is latched on well and actively sucking. Your baby will let you know  when he or she is done breastfeeding by pulling away from your breast or falling asleep. °· Avoid introducing bottles or pacifiers to your baby in the early weeks of breastfeeding. Wait to introduce these things until after resolving any breastfeeding challenges. °· Try to pump your milk on the same schedule as when your baby would breastfeed if you are returning to work or away from home for an extended period. °· Drink plenty of fluids to avoid dehydration, which can eventually put you at greater risk of breast engorgement. °If you follow these suggestions, your engorgement should improve in 24-48 hours. If you are still experiencing difficulty, call your lactation consultant or health care provider.  °Challenge--Plugged Milk Ducts °Plugged milk ducts occur when the duct does not drain milk effectively and becomes swollen. Wearing a tight-fitting nursing bra or having   difficulty with latching may cause plugged milk ducts. Not drinking enough water (8-10 c [1.9-2.4 L] per day) can contribute to plugged milk ducts. Once a duct has become plugged, hard lumps, soreness, and redness may develop in your breast.  °Solution °Do not delay feedings. Feed your baby frequently and try to empty your breasts of milk at each feeding. Try breastfeeding from the affected side first so there is a better chance that the milk will drain completely from that breast. Apply warm, moist towels to your breasts for 5-10 minutes before feeding. Alternatively, a hot shower right before breastfeeding can provide the moist heat that can encourage milk flow. Gentle massage of the sore area before and during a feeding may also help. Avoid wearing tight clothing or bras that put pressure on your breasts. Wear bras that offer good support to your breasts, but avoid underwire bras. If you have a plugged milk duct and develop a fever, you need to see your health care provider.  °Challenge--Mastitis °Mastitis is inflammation of your breast. It  usually is caused by a bacterial infection and can cause flu-like symptoms. You may develop redness in your breast and a fever. Often when mastitis occurs, your breast becomes firm, warm, and very painful. The most common causes of mastitis are poor latching, ineffective sucking from your baby, consistent pressure on your breast (possibly from wearing a tight-fitting bra or shirt that restricts the milk flow), unusual stress or fatigue, or missed feedings.  °Solution °You will be given antibiotic medicine to treat the infection. It is still important to breastfeed frequently to empty your breasts. Continuing to breastfeed while you recover from mastitis will not harm your baby. Make sure your baby is positioned properly during every feeding. Apply moist heat to your breasts for a few minutes before feeding to help the milk flow and to help your breasts empty more easily. °Challenge--Thrush °Thrush is a yeast infection that can form on your nipples, in your breast, or in your baby's mouth. It causes itching, soreness, burning or stabbing pain, and sometimes a rash.  °Solution °You will be given a medicated ointment for your nipples, and your baby will be given a liquid medicine for his or her mouth. It is important that you and your baby are treated at the same time because thrush can be passed between you and your baby. Change disposable nursing pads often. Any bras, towels, or clothing that come in contact with infected areas of your body or your baby's body need to be washed in very hot water every day. Wash your hands and your baby's hands often. All pacifiers, bottle nipples, or toys your baby puts in his or her mouth should be boiled once a day for 20 minutes. After 1 week of treatment, discard pacifiers and bottle nipples and buy new ones. All breast pump parts that touch the milk need to be boiled for 20 minutes every day. °Challenge--Low Milk Supply °You may not be producing enough milk if your baby is not  gaining the proper amount of weight. Breast milk production is based on a supply-and-demand system. Your milk supply depends on how frequently and effectively your baby empties your breast.  °Solution °The more you breastfeed and pump, the more breast milk you will produce. It is important that your baby empties at least one of your breasts at each feeding. If this is not happening, then use a breast pump or hand express any milk that remains. This will help to drain as   much milk as possible at each feeding. It will also signal your body to produce more milk. If your baby is not emptying your breasts, it may be due to latching, sucking, or positioning problems. If low milk supply continues after addressing these issues, contact your health care provider or a lactation specialist as soon as possible. °Challenge--Inverted or Flat Nipples °Some women have nipples that turn inward instead of protruding outward. Other women have nipples that are flat. Inverted or flat nipples can sometimes make it more difficult for your baby to latch onto your breast. °Solution °You may be given a small device that pulls out inverted nipples. This device should be applied right before your baby is brought to your breast. You can also try using a breast pump for a short time before placing the baby at your breast. The pump can pull your nipple outwards to help your infant latch more easily. The baby's sucking motion will help the inverted nipple protrude as well.  °If you have flat nipples, encourage your baby to latch onto your breast and feed frequently in the early days after birth. This will give your baby practice latching on correctly while your breast is still soft. When your milk supply increases, between the second and fifth day after birth and your breasts become full, your baby will have an easier time latching.  °Contact a lactation consultant if you still have concerns. She or he can teach you additional techniques to  address breastfeeding problems related to nipple shape and position.  °FOR MORE INFORMATION °[employer]: www.llli.org °  °This information is not intended to replace advice given to you by your health care provider. Make sure you discuss any questions you have with your health care provider. °  °Document Released: 02/03/2006 Document Revised: 09/02/2014 Document Reviewed: 02/05/2013 °[employer] Interactive Patient Education ©2016 [employer] ° °

## 2016-06-08 LAB — TYPE AND SCREEN
ABO/RH(D): O POS
Antibody Screen: NEGATIVE
Unit division: 0
Unit division: 0

## 2016-07-15 ENCOUNTER — Ambulatory Visit (INDEPENDENT_AMBULATORY_CARE_PROVIDER_SITE_OTHER): Payer: Medicaid Other | Admitting: Student

## 2016-07-15 NOTE — Progress Notes (Signed)
Subjective:     Jacqueline Bowen is a 30 y.o. female who presents for a postpartum visit. She is 5 weeks postpartum following a:delivery on 06/05/2016. I have fully reviewed the prenatal and intrapartum course. The delivery was at 40 gestational weeks. Outcome: vaginal. Anesthesia: Epidual. Postpartum course has been uncomplicated. Baby's course has been uncomplicated. Baby is feeding by breast. Bleeding not at this time. Bowel function is normal. Bladder function is normal. Patient is not sexually active. Contraception method is none at this time desire natural birth control. Postpartum depression screening: negative  The following portions of the patient's history were reviewed and updated as appropriate: allergies, current medications, past family history, past medical history, past social history, past surgical history and problem list.  Review of Systems Pertinent items are noted in HPI.   Objective:    BP 112/75   Pulse 81   Wt 160 lb 8 oz (72.8 kg)   BMI 25.91 kg/m   General:  alert, cooperative, appears stated age and no distress  Lungs: clear to auscultation bilaterally  Heart:  regular rate and rhythm, S1, S2 normal, no murmur, click, rub or gallop  Abdomen: soft, non-tender; bowel sounds normal; no masses,  no organomegaly       Assessment:     Normal 6 week postpartum exam. Pap smear not done at today's visit.   Plan:    1. Contraception: condoms and rhythm method 2. Follow up in: 1 year or as needed.

## 2016-07-15 NOTE — Patient Instructions (Signed)

## 2017-04-01 ENCOUNTER — Emergency Department (HOSPITAL_COMMUNITY)
Admission: EM | Admit: 2017-04-01 | Discharge: 2017-04-01 | Disposition: A | Discharge status: Home / Self Care | Payer: Medicaid Other | Attending: Emergency Medicine | Admitting: Emergency Medicine

## 2017-04-01 ENCOUNTER — Emergency Department (HOSPITAL_COMMUNITY): Payer: Medicaid Other

## 2017-04-01 ENCOUNTER — Encounter (HOSPITAL_COMMUNITY): Payer: Self-pay

## 2017-04-01 ENCOUNTER — Ambulatory Visit (HOSPITAL_COMMUNITY)
Admission: EM | Admit: 2017-04-01 | Discharge: 2017-04-01 | Disposition: A | Discharge status: Nursing Home (Includes ICF) | Payer: Medicaid Other | Attending: Family Medicine | Admitting: Family Medicine

## 2017-04-01 ENCOUNTER — Encounter (HOSPITAL_COMMUNITY): Payer: Self-pay | Admitting: *Deleted

## 2017-04-01 DIAGNOSIS — E041 Nontoxic single thyroid nodule: Secondary | ICD-10-CM | POA: Diagnosis not present

## 2017-04-01 DIAGNOSIS — M542 Cervicalgia: Secondary | ICD-10-CM | POA: Diagnosis present

## 2017-04-01 DIAGNOSIS — E079 Disorder of thyroid, unspecified: Secondary | ICD-10-CM

## 2017-04-01 DIAGNOSIS — R221 Localized swelling, mass and lump, neck: Secondary | ICD-10-CM | POA: Diagnosis not present

## 2017-04-01 LAB — BASIC METABOLIC PANEL
Anion gap: 10 (ref 5–15)
BUN: 8 mg/dL (ref 6–20)
CO2: 27 mmol/L (ref 22–32)
Calcium: 9.4 mg/dL (ref 8.9–10.3)
Chloride: 101 mmol/L (ref 101–111)
Creatinine, Ser: 0.71 mg/dL (ref 0.44–1.00)
GFR calc Af Amer: >60 mL/min (ref >60)
GFR calc non Af Amer: >60 mL/min (ref >60)
Glucose, Bld: 94 mg/dL (ref 65–99)
Potassium: 3.8 mmol/L (ref 3.5–5.1)
Sodium: 138 mmol/L (ref 135–145)

## 2017-04-01 LAB — CBC WITH DIFFERENTIAL/PLATELET
Basophils Absolute: 0 10*3/uL (ref 0.0–0.1)
Basophils Relative: 0 %
Eosinophils Absolute: 0 10*3/uL (ref 0.0–0.7)
Eosinophils Relative: 0 %
HCT: 36.4 % (ref 36.0–46.0)
Hemoglobin: 12.2 g/dL (ref 12.0–15.0)
Lymphocytes Relative: 17 %
Lymphs Abs: 1 10*3/uL (ref 0.7–4.0)
MCH: 26.2 pg (ref 26.0–34.0)
MCHC: 33.5 g/dL (ref 30.0–36.0)
MCV: 78.1 fL (ref 78.0–100.0)
Monocytes Absolute: 0.5 10*3/uL (ref 0.1–1.0)
Monocytes Relative: 8 %
Neutro Abs: 4.4 10*3/uL (ref 1.7–7.7)
Neutrophils Relative %: 75 %
Platelets: 216 10*3/uL (ref 150–400)
RBC: 4.66 MIL/uL (ref 3.87–5.11)
RDW: 12.3 % (ref 11.5–15.5)
WBC: 5.8 10*3/uL (ref 4.0–10.5)

## 2017-04-01 LAB — TSH: TSH: 0.504 u[IU]/mL (ref 0.350–4.500)

## 2017-04-01 MED ORDER — IOPAMIDOL (ISOVUE-300) INJECTION 61%
INTRAVENOUS | Status: AC
  Administered 2017-04-01: 75 mL
  Filled 2017-04-01: qty 75

## 2017-04-01 NOTE — ED Triage Notes (Signed)
Pt  Reports     Symptoms  Of  sorethoat    And   Swollen  Neck   With  Symptoms  For  sev  Days

## 2017-04-01 NOTE — Discharge Instructions (Signed)
I am sending you to the emergency room because of this neck mass. I'm concerned about the possibility of abscess, or tumor, this needs imaging to diagnose it. Go to the emergency room for further evaluation

## 2017-04-01 NOTE — ED Provider Notes (Signed)
[facility] Provider Note   CSN: [id number] Arrival date & time: 04/01/17  1058  By signing my name below, I, [name], attest that this documentation has been prepared under the direction and in the presence of [employer], PA-C. Electronically Signed: [name], ED Scribe. 04/01/17. 3:17 PM.  History   Chief Complaint Chief Complaint  Patient presents with  . neck/ear pain   The history is provided by the patient. No language interpreter was used.   HPI Comments: Jacqueline Bowen is a 31 y.o. female who presents to the Emergency Department complaining of gradually resolving left-sided neck and facial pain with associated swelling that began a week ago. The pt also has associated low grade fever (100.54F) and states that her pain radiates to her left ear. Her symptoms are exacerbated when swallowing and when the area is touched. The pt was seen for the same today in [facility]. She denies sore throat.  Past Medical History:  Diagnosis Date  . Medical history non-contributory     Patient Active Problem List   Diagnosis Date Noted  . VBAC (vaginal birth after Cesarean) 06/07/2016  . Supervision of normal pregnancy, antepartum 12/20/2015  . History of cesarean section complicating pregnancy 11/22/2015    Past Surgical History:  Procedure Laterality Date  . CESAREAN SECTION N/A 08/11/2013   Procedure: CESAREAN SECTION;  Surgeon: Jeani Hawking, MD;  Location: [facility];  Service: Obstetrics;  Laterality: N/A;    OB History    Gravida Para Term Preterm AB Living   3 2 2  0 1 2   SAB TAB Ectopic Multiple Live Births   1 0 0 0 2       Home Medications    Prior to Admission medications   Medication Sig Start Date End Date Taking? Authorizing Provider  ibuprofen (ADVIL,MOTRIN) 600 MG tablet Take 1 tablet (600 mg total) by mouth every 6 (six) hours. Patient not taking: Reported on 07/15/2016 06/07/16   Dorathy Kinsman, CNM  Prenatal Vit-Fe Fumarate-FA  (PRENATAL MULTIVITAMIN) TABS tablet Take 1 tablet by mouth daily at 12 noon.    [provider]    Family History Family History  Problem Relation Age of Onset  . Heart attack Neg Hx     Social History Social History  Substance Use Topics  . Smoking status: Never Smoker  . Smokeless tobacco: Never Used  . Alcohol use No     Allergies   Patient has no known allergies.   Review of Systems Review of Systems  Constitutional: Positive for fever (100.6).  HENT: Positive for ear pain and facial swelling. Negative for sore throat and trouble swallowing.   Respiratory: Negative for shortness of breath.   Gastrointestinal: Positive for nausea. Negative for abdominal pain and vomiting.  Musculoskeletal: Positive for neck pain.     Physical Exam Updated Vital Signs BP 105/73   Pulse 89   Temp 98.7 F (37.1 C)   Resp (!) 24   SpO2 100%   Physical Exam  Constitutional: She appears well-developed and well-nourished. No distress.  HENT:  Head: Normocephalic and atraumatic.  Mouth/Throat: Oropharynx is clear and moist. No trismus in the jaw. No uvula swelling. No oropharyngeal exudate, posterior oropharyngeal edema, posterior oropharyngeal erythema or tonsillar abscesses. Tonsils are 0 on the right. Tonsils are 0 on the left.  Eyes: Pupils are equal, round, and reactive to light. Conjunctivae are normal. Right eye exhibits no discharge. Left eye exhibits no discharge. No scleral icterus.  Neck: Normal range  of motion. Neck supple. No thyromegaly present.  ~ 5 x 5 cm tender mass to the neck, indurated, non-fluctuant. No erythema or warmth.  Cardiovascular: Normal rate, regular rhythm, normal heart sounds and intact distal pulses.  Exam reveals no gallop and no friction rub.   No murmur heard. Pulmonary/Chest: Effort normal and breath sounds normal. No stridor. No respiratory distress. She has no wheezes. She has no rales.  Abdominal: Soft. Bowel sounds are normal. She  exhibits no distension. There is no tenderness. There is no rebound and no guarding.  Musculoskeletal: She exhibits no edema.  Lymphadenopathy:    She has no cervical adenopathy.  Neurological: She is alert. Coordination normal.  Skin: Skin is warm and dry. No rash noted. She is not diaphoretic. No pallor.  Psychiatric: She has a normal mood and affect.  Nursing note and vitals reviewed.        ED Treatments / Results   DIAGNOSTIC STUDIES: Oxygen Saturation is 100% on RA, normal by my interpretation.   COORDINATION OF CARE: 12:29 PM-Discussed next steps with pt. Pt verbalized understanding and is agreeable with the plan.   Labs (all labs ordered are listed, but only abnormal results are displayed) Labs Reviewed  BASIC METABOLIC PANEL  CBC WITH DIFFERENTIAL/PLATELET  TSH  T4, FREE  T3, FREE    EKG  EKG Interpretation None       Radiology Ct Soft Tissue Neck W Contrast  Result Date: 04/01/2017 CLINICAL DATA:  Neck pain 3 days.  Neck swelling last week.  Fever EXAM: CT NECK WITH CONTRAST TECHNIQUE: Multidetector CT imaging of the neck was performed using the standard protocol following the bolus administration of intravenous contrast. CONTRAST:  75mL ISOVUE-300 IOPAMIDOL (ISOVUE-300) INJECTION 61% COMPARISON:  None. FINDINGS: Pharynx and larynx: Normal. No mass or swelling. Salivary glands: No inflammation, mass, or stone. Thyroid: Large mass left lobe of thyroid measuring 5.4 x 4.3 x 5.3 cm. This has homogeneous fluid density and is surrounded by thin rim of normal-appearing thyroid. There is some soft tissue edema surrounding the mass. The trachea is deviated to the right and mildly flattened without significant airway compromise. Small 5 mm right thyroid nodule. Right thyroid lobe normal in size Lymph nodes: Negative Vascular: Negative Limited intracranial: Negative Visualized orbits: Negative Mastoids and visualized paranasal sinuses: Negative Skeleton: Negative Upper  chest: Negative Other: None IMPRESSION: Large fluid-filled mass left lobe of the thyroid with surrounding edema. Given the history of sudden increase in size and pain, this may represent hemorrhage into a thyroid adenoma or neoplasm. Abscess seems less likely given the normal white count and lack of a thick enhancing wall. These results were called by telephone at the time of interpretation on 04/01/2017 at 2:01 pm to [facility] Shanik [place] , who verbally acknowledged these results. Electronically Signed   By: Marlan Palau M.D.   On: 04/01/2017 14:02    Procedures Procedures (including critical care time)  Medications Ordered in ED Medications  iopamidol (ISOVUE-300) 61 % injection (75 mLs  Contrast Given 04/01/17 1330)     Initial Impression / Assessment and Plan / ED Course  I have reviewed the triage vital signs and the nursing notes.  Pertinent labs & imaging results that were available during my care of the patient were reviewed by me and considered in my medical decision making (see chart for details).     Patient with thyroid mass on CT (Large fluid-filled mass left lobe of the thyroid with surrounding edema. Given the history of sudden  increase in size and pain, this may represent hemorrhage into a thyroid adenoma or neoplasm. Abscess seems less likely given the normal white count and lack of a thick enhancing wall.) Initial labs are unremarkable (CBC, BMP, TSH) and free T3, T4 pending. I consulted Dr. Lazarus SalinesWolicki with ear nose and throat who would like to see the patient in the office today sooner as possible. Patient understands and agrees with plan. Patient vitals stable throughout ED course and discharged in satisfactory condition. I discussed patient case with Dr. Lynelle DoctorKnapp who guided the patient's management and agrees with plan.   Final Clinical Impressions(s) / ED Diagnoses   Final diagnoses:  Thyroid mass    New Prescriptions New Prescriptions   No medications on file   I personally  performed the services described in this documentation, which was scribed in my presence. The recorded information has been reviewed and is accurate.     Emi HolesLaw, Kito Cuffe M, PA-C 04/01/17 1518    Linwood DibblesKnapp, Jon, MD 04/03/17 [phone]

## 2017-04-01 NOTE — ED Triage Notes (Signed)
Patient complains of left sided neck and face pain with radiation to left ear. States that the discomfort worse yesterday and better today, no trauma. No sore throat, no cold symptoms

## 2017-04-01 NOTE — ED Provider Notes (Signed)
  [facility]   [id number] 04/01/17 Arrival Time: [id number]  ASSESSMENT & PLAN:  1. Neck mass     No orders of the defined types were placed in this encounter.  Sent to the emergency room for imaging rule out abscess, tumor, or other concerning pathology Reviewed expectations re: course of current medical issues. Questions answered. Outlined signs and symptoms indicating need for more acute intervention. Patient verbalized understanding. After Visit Summary given.   SUBJECTIVE:  Jacqueline Bowen is a 31 y.o. female who presents with complaint of Sore throat for 5 days. States she had a fever on the first day that resolved. But the pain has remained. Over the course of the week, she has had a area of swelling on her neck that has grown larger, and more painful. She reports painful swallowing, and she reports spitting up secretions in the morning due to pain with swallowing. However, she has no difficulty breathing, in no change in her voice. She has no unexpected weight loss or weight gain, hot or cold intolerance, no known thyroid history. She has no recent travel outside of the [place], or other known risk factors.  ROS: As per HPI, remainder of ROS negative.   OBJECTIVE:  Vitals:   04/01/17 1014 04/01/17 1015  BP: 120/83   Pulse: (!) 105   Resp: 16   Temp: 98.9 F (37.2 C)   TempSrc: Oral   SpO2: 99%   Weight:  160 lb (72.6 kg)  Height:  5\' 6"  (1.676 m)     General appearance: alert; no distress HEENT: normocephalic; atraumatic; conjunctivae normal; Tympanic membranes normal Neck: Large, tender, palpable mass left lateral side of the neck, unable to assess for cervical lymphadenopathy due to pain near the mass. There is no erythema noted, photographs obtained and posted below Lungs: clear to auscultation bilaterally Heart: regular rate and rhythm Abdomen: soft, non-tender; bowel sounds normal; no masses or organomegaly; no guarding or rebound tenderness Back: no CVA  tenderness Extremities: no cyanosis or edema; symmetrical with no gross deformities Skin: warm and dry Neurologic: Grossly normal Psychological:  alert and cooperative; normal mood and affect       Procedures:     Labs Reviewed - No data to display  No results found.  No Known Allergies  PMHx, SurgHx, SocialHx, Medications, and Allergies were reviewed in the Visit Navigator and updated as appropriate.       Dorena BodoKennard, Zamariah Seaborn, NP 04/01/17 1049

## 2017-04-01 NOTE — ED Notes (Signed)
Discharge instructions reviewed with pt. Pt to go to Dr. Raye SorrowWolicki's office after discharge. Pt verbalized instructions.

## 2017-04-01 NOTE — Discharge Instructions (Signed)
Go right now to see Dr. Lazarus SalinesWolicki, the ear nose and throat doctor, in his office for further evaluation and treatment.

## 2017-04-15 ENCOUNTER — Other Ambulatory Visit: Payer: Self-pay | Admitting: Otolaryngology

## 2017-04-15 DIAGNOSIS — E041 Nontoxic single thyroid nodule: Secondary | ICD-10-CM

## 2017-07-14 ENCOUNTER — Ambulatory Visit
Admission: RE | Admit: 2017-07-14 | Discharge: 2017-07-14 | Disposition: A | Discharge status: Home / Self Care | Payer: No Typology Code available for payment source | Source: Ambulatory Visit | Attending: Otolaryngology | Admitting: Otolaryngology

## 2017-07-14 DIAGNOSIS — E041 Nontoxic single thyroid nodule: Secondary | ICD-10-CM

## 2017-12-01 ENCOUNTER — Encounter: Payer: Self-pay | Admitting: *Deleted

## 2018-02-10 ENCOUNTER — Ambulatory Visit (INDEPENDENT_AMBULATORY_CARE_PROVIDER_SITE_OTHER): Payer: Self-pay | Admitting: Family Medicine

## 2018-02-10 ENCOUNTER — Other Ambulatory Visit (HOSPITAL_COMMUNITY)
Admission: RE | Admit: 2018-02-10 | Discharge: 2018-02-10 | Disposition: A | Discharge status: Home / Self Care | Payer: Medicaid Other | Source: Ambulatory Visit | Attending: Medical | Admitting: Medical

## 2018-02-10 ENCOUNTER — Telehealth: Payer: Self-pay | Admitting: General Practice

## 2018-02-10 ENCOUNTER — Encounter: Payer: Self-pay | Admitting: Family Medicine

## 2018-02-10 VITALS — BP 113/67 | HR 83 | Wt 148.5 lb

## 2018-02-10 DIAGNOSIS — Z3480 Encounter for supervision of other normal pregnancy, unspecified trimester: Secondary | ICD-10-CM | POA: Insufficient documentation

## 2018-02-10 DIAGNOSIS — Z3400 Encounter for supervision of normal first pregnancy, unspecified trimester: Secondary | ICD-10-CM | POA: Diagnosis not present

## 2018-02-10 DIAGNOSIS — Z3A00 Weeks of gestation of pregnancy not specified: Secondary | ICD-10-CM | POA: Diagnosis not present

## 2018-02-10 DIAGNOSIS — Z3401 Encounter for supervision of normal first pregnancy, first trimester: Secondary | ICD-10-CM

## 2018-02-10 DIAGNOSIS — O34219 Maternal care for unspecified type scar from previous cesarean delivery: Secondary | ICD-10-CM

## 2018-02-10 LAB — POCT URINALYSIS DIP (DEVICE)
Bilirubin Urine: NEGATIVE
Glucose, UA: NEGATIVE mg/dL
HGB URINE DIPSTICK: NEGATIVE
Ketones, ur: NEGATIVE mg/dL
Leukocytes, UA: NEGATIVE
Nitrite: NEGATIVE
PH: 6.5 (ref 5.0–8.0)
PROTEIN: NEGATIVE mg/dL
SPECIFIC GRAVITY, URINE: 1.025 (ref 1.005–1.030)
Urobilinogen, UA: 0.2 mg/dL (ref 0.0–1.0)

## 2018-02-10 NOTE — Addendum Note (Signed)
Addended by: Judd GaudierLEVENS, SHANNON M on: 02/10/2018 02:22 PM   Modules accepted: Orders

## 2018-02-10 NOTE — Progress Notes (Signed)
NEW PRENATAL VISIT NOTE  Subjective:  Jacqueline Bowen is a [id number] at [redacted]w[redacted]d being seen today for her first obstetrical visit.  Her obstetrical history is significant for history to LTCS, followed by VBAC.Marland Kitchen Patient does intend to breast feed. Pregnancy history fully reviewed.  Patient reports no complaints.  ROS: +nausea and vomiting, bu overall improving. Denies cramping, abdominal pain, vaginal bleeding, vaginal discharge, breast tenderness, dizziness, SOB, lightheadedness, dizziness or headaches.  HISTORY: OB History  Gravida Para Term Preterm AB Living  4 2 2  0 1 2  SAB TAB Ectopic Multiple Live Births  1 0 0 0 2    # Outcome Date GA Lbr Len/2nd Weight Sex Delivery Anes PTL Lv  4 Current           3 Term 06/05/16 [redacted]w[redacted]d 23:50 / 01:24 7 lb 10.9 oz (3.485 kg) F VBAC EPI  LIV  2 Term 08/11/13 [redacted]w[redacted]d  7 lb 7.2 oz (3.38 kg) F CS-LTranv Spinal  LIV     Birth Comments: states had c/s due to meconium after AROM, NRFHTs remote from delivery. Dilated 2 cm.  1 SAB             Past Medical History:  Diagnosis Date  . Medical history non-contributory     Past Surgical History:  Procedure Laterality Date  . CESAREAN SECTION N/A 08/11/2013   Procedure: CESAREAN SECTION;  Surgeon: Jeani Hawking, MD;  Location: [facility];  Service: Obstetrics;  Laterality: N/A;    Family History  Problem Relation Age of Onset  . Heart attack Neg Hx     Objective:   Vitals:   02/10/18 0856  BP: 113/67  Pulse: 83  Weight: 148 lb 8 oz (67.4 kg)    General appearance: Well nourished, well developed female in no acute distress.   HENT: normal oral mucosa, MMM Eyes: no scleral icterus, normal conjunctivae Neck:  Supple, normal appearance, and no thyromegaly  Cardiovascular: normal rate, regular rhythm, no murmurs/rubs/gallops Respiratory:  Clear to auscultation bilaterally. Normal respiratory effort Abdomen: positive bowel sounds and no masses, hernias; diffusely non tender to palpation, non  distended Breasts: breasts appear normal, no suspicious masses, no skin or nipple changes or axillary nodes. Neuro/Psych:  Normal mood and affect.  Skin:  Warm and dry.  Lymphatic:  No inguinal lymphadenopathy.   Pelvic exam: is not limited by body habitus EGBUS: within normal limits, Vagina: within normal limits and with no blood in the vault, Cervix:  closed/long/high, Uterus: normal consistency, nontender and Adnexa:  normal adnexa and no mass, fullness, tenderness  Fetal Status: Fetal Heart Rate (bpm): 162   Movement: Absent      Assessment and Plan:  Pregnancy: [id number] at [redacted]w[redacted]d  1. Supervision of normal first pregnancy, antepartum - [facility] AMB BABYSCRIPTS OPT IN - Culture, OB Urine - Cystic fibrosis gene test - Hemoglobinopathy Evaluation - Obstetric Panel, Including HIV - SMN1 COPY NUMBER ANALYSIS (SMA Carrier Screen) - [place] MFM OB COMP + 14 WK; Future  2. Hx of prior C/S: LTCS, s/p VBAC x 1 - Would like to VBAC again  Initial labs drawn. Continue prenatal vitamins. Genetic Screening discussed, declined (self-pay). Ultrasound discussed; fetal anatomic survey: requested at [facility] Problem list reviewed and updated. The nature of [facility] with multiple MDs and other Advanced Practice Providers was explained to patient; also emphasized that residents, students are part of our team. Routine obstetric precautions reviewed.  Return in about 1 month (around 03/10/2018) for  LOB.    Kandra NicolasJulie P. Degele, MD [facility] for [facility] Mid[facility](Faculty Practice)

## 2018-02-10 NOTE — Patient Instructions (Addendum)
To improve nausea and vomiting in pregnancy: - Take Vitamin B6 (you can get this over the counter) and take 3 times a day.   To help with constipation: - Drink at least 80oz of water a day - Increase intake of fruits and vegetables daily - Go on a light 20-30 min walk daily  - You can get docusate (Colace) over the counter and take twice a day. This is a stool softner.  - If sill having constipation, get Miralax (or generic brand) over the counter, and take one capful a day.   Places to have your son circumcised:    [facility] [phone] while you are in hospital  [facility] [phone] $244 by 4 wks  [facility] [phone] $175 by 2 wks  [facility] [phone] $250 by 7 days [facility] [phone] $269 by 4 wks  These prices sometimes change but are roughly what you can expect to pay. Please call and confirm pricing.   Circumcision is considered an elective/non-medically necessary procedure. There are many reasons parents decide to have their sons circumsized. During the first year of life circumcised males have a reduced risk of urinary tract infections but after this year the rates between circumcised males and uncircumcised males are the same.  It is safe to have your son circumcised outside of the hospital and the places above perform them regularly.   Deciding about Circumcision in Baby Boys  (Up-to-date The Basics)  What is circumcision?  Circumcision is a surgery that removes the skin that covers the tip of the penis, called the "foreskin" Circumcision is usually done when a boy is between 41 and 89 days old. In the [place], circumcision is common. In some other countries, fewer boys are circumcised. Circumcision is a common tradition in some religions.  Should I have my baby boy circumcised?  There is  no easy answer. Circumcision has some benefits. But it also has risks. After talking with your doctor, you will have to decide for yourself what is right for your family.  What are the benefits of circumcision?  Circumcised boys seem to have slightly lower rates of: ?Urinary tract infections ?Swelling of the opening at the tip of the penis Circumcised men seem to have slightly lower rates of: ?Urinary tract infections ?Swelling of the opening at the tip of the penis ?Penis cancer ?HIV and other infections that you catch during sex ?Cervical cancer in the women they have sex with Even so, in the [place], the risks of these problems are small - even in boys and men who have not been circumcised. Plus, boys and men who are not circumcised can reduce these extra risks by: ?Cleaning their penis well ?Using condoms during sex  What are the risks of circumcision?  Risks include: ?Bleeding or infection from the surgery ?Damage to or amputation of the penis ?A chance that the doctor will cut off too much or not enough of the foreskin ?A chance that sex won't feel as good later in life Only about 1 out of every 200 circumcisions leads to problems. There is also a chance that your health insurance won't pay for circumcision.  How is circumcision done in baby boys?  First, the baby gets medicine for pain relief. This might be a cream on the skin or a shot into the base of the penis. Next, the doctor cleans the baby's penis well. Then he or she uses special tools to cut off the foreskin. Finally, the doctor wraps a bandage (  called gauze) around the baby's penis. If you have your baby circumcised, his doctor or nurse will give you instructions on how to care for him after the surgery. It is important that you follow those instructions carefully.  BENEFITS OF BREASTFEEDING Many women wonder if they should breastfeed. Research shows that breast milk contains the perfect balance of vitamins,  protein and fat that your baby needs to grow. It also contains antibodies that help your baby's immune system to fight off viruses and bacteria and can reduce the risk of sudden infant death syndrome (SIDS). In addition, the colostrum (a fluid secreted from the breast in the first few days after delivery) helps your newborn's digestive system to grow and function well. Breast milk is easier to digest than formula. Also, if your baby is born preterm, breast milk can help to reduce both short- and long-term health problems. BENEFITS OF BREASTFEEDING FOR MOM . Breastfeeding causes a hormone to be released that helps the uterus to contract and return to its normal size more quickly. . It aids in postpartum weight loss, reduces risk of breast and ovarian cancer, heart disease and rheumatoid arthritis. . It decreases the amount of bleeding after the baby is born. benefits of breastfeeding for baby . Provides comfort and nutrition . Protects baby against - Obesity - Diabetes - Asthma - Childhood cancers - Heart disease - Ear infections - Diarrhea - Pneumonia - Stomach problems - Serious allergies - Skin rashes . Promotes growth and development . Reduces the risk of baby having Sudden Infant Death Syndrome (SIDS) only breastmilk for the first 6 months . Protects baby against diseases/allergies . It's the perfect amount for tiny bellies . It restores baby's energy . Provides the best nutrition for baby . Giving water or formula can make baby more likely to get sick, decrease Mom's milk supply, make baby less content with breastfeeding Skin to Skin After delivery, the staff will place your baby on your chest. This helps with the following: . Regulates baby's temperature, breathing, heart rate and blood sugar . Increases Mom's milk supply . Promotes bonding . Keeps baby and Mom calm and decreases baby's crying Rooming In Your baby will stay in your room with you for the entire time you are in  the hospital. This helps with the following: . Allows Mom to learn baby's feeding cues - Fluttering eyes - Sucking on tongue or hand - Rooting (opens mouth and turns head) - Nuzzling into the breast - Bringing hand to mouth . Allows breastfeeding on demand (when your baby is ready) . Helps baby to be calm and content . Ensures a good milk supply . Prevents complications with breastfeeding . Allows parents to learn to care for baby . Allows you to request assistance with breastfeeding Importance of a good latch . Increases milk transfer to baby - baby gets enough milk . Ensures you have enough milk for your baby . Decreases nipple soreness . Don't use pacifiers and bottles - these cause baby to suck differently than breastfeeding . Promotes continuation of breastfeeding Risks of Formula Supplementation with Breastfeeding Giving your infant formula in addition to your breast-milk EXCEPT when medically necessary can lead to: Marland Kitchen Decreases your milk supply  . Loss of confidence in yourself for providing baby's nutrition  . Engorgement and possibly mastitis  . Asthma & allergies in the baby BREASTFEEDING FAQS How long should I breastfeed my baby? It is recommended that you provide your baby with breast milk only for the  first 6 months and then continue for the first year and longer as desired. During the first few weeks after birth, your baby will need to feed 8-12 times every 24 hours, or every 2-3 hours. They will likely feed for 15-30 minutes. How can I help my baby begin breastfeeding? Babies are born with an instinct to breastfeed. A healthy baby can begin breastfeeding right away without specific help. At the hospital, a nurse (or lactation consultant) will help you begin the process and will give you tips on good positioning. It may be helpful to take a breastfeeding class before you deliver in order to know what to expect. How can I help my baby latch on? In order to assist your baby  in latching-on, cup your breast in your hand and stroke your baby's lower lip with your nipple to stimulate your baby's rooting reflex. Your baby will look like he or she is yawning, at which point you should bring the baby towards your breast, while aiming the nipple at the roof of his or her mouth. Remember to bring the baby towards you and not your breast towards the baby. How can I tell if my baby is latched-on? Your baby will have all of your nipple and part of the dark area around the nipple in his or her mouth and your baby's nose will be touching your breast. You should see or hear the baby swallowing. If the baby is not latched-on properly, start the process over. To remove the suction, insert a clean finger between your breast and the baby's mouth. Should I switch breasts during feeding? After feeding on one side, switch the baby to your other breast. If he or she does not continue feeding - that is OK. Your baby will not necessarily need to feed from both breasts in a single feeding. On the next feeding, start with the other breast for efficiency and comfort. How can I tell if my baby is hungry? When your baby is hungry, they will nuzzle against your breast, make sucking noises and tongue motions and may put their hands near their mouth. Crying is a late sign of hunger, so you should not wait until this point. When they have received enough milk, they will unlatch from the breast. Is it okay to use a pacifier? Until your baby gets the hang of breastfeeding, experts recommend limiting pacifier usage. If you have questions about this, please contact your pediatrician. What can I do to ensure proper nutrition while breastfeeding? . Make sure that you support your own health and your baby's by eating a healthy, well-balanced diet . Your provider may recommend that you continue to take your prenatal vitamin . Drink plenty of fluids. It is a good rule to drink one glass of water before or after  feeding . Alcohol will remain in the breast milk for as long as it will remain in the blood stream. If you choose to have a drink, it is recommended that you wait at least 2 hours before feeding . Moderate amounts of caffeine are OK . Some over-the-counter or prescription medications are not recommended during breastfeeding. Check with your provider if you have questions What types of birth control methods are safe while breastfeeding? Progestin-only methods, including a daily pill, an IUD, the implant and the injection are safe while breastfeeding. Methods that contain estrogen (such as combination birth control pills, the vaginal ring and the patch) should not be used during the first month of breastfeeding as these  can decrease your milk supply.

## 2018-02-10 NOTE — Telephone Encounter (Signed)
Charity Application given to patient and number to [place] [employer] to help set up for Medicaid.

## 2018-02-11 LAB — CERVICOVAGINAL ANCILLARY ONLY
Bacterial vaginitis: NEGATIVE
Candida vaginitis: NEGATIVE
Chlamydia: NEGATIVE
Neisseria Gonorrhea: NEGATIVE
TRICH (WINDOWPATH): NEGATIVE

## 2018-02-12 LAB — URINE CULTURE, OB REFLEX: ORGANISM ID, BACTERIA: NO GROWTH

## 2018-02-12 LAB — CULTURE, OB URINE

## 2018-02-18 LAB — HEMOGLOBINOPATHY EVALUATION
Ferritin: 232 ng/mL — ABNORMAL HIGH (ref 15–150)
HGB C: 0 %
HGB F QUANT: 0 % (ref 0.0–2.0)
HGB S: 0 %
Hgb A2 Quant: 2.8 % (ref 1.8–3.2)
Hgb A: 97.2 % (ref 96.4–98.8)
Hgb Solubility: NEGATIVE
Hgb Variant: 0 %

## 2018-02-18 LAB — OBSTETRIC PANEL, INCLUDING HIV
ANTIBODY SCREEN: NEGATIVE
Basophils Absolute: 0 10*3/uL (ref 0.0–0.2)
Basos: 0 %
EOS (ABSOLUTE): 0 10*3/uL (ref 0.0–0.4)
EOS: 0 %
HEMATOCRIT: 31.6 % — AB (ref 34.0–46.6)
HIV SCREEN 4TH GENERATION: NONREACTIVE
Hemoglobin: 10.8 g/dL — ABNORMAL LOW (ref 11.1–15.9)
Hepatitis B Surface Ag: NEGATIVE
Immature Grans (Abs): 0 10*3/uL (ref 0.0–0.1)
Immature Granulocytes: 0 %
Lymphocytes Absolute: 0.6 10*3/uL — ABNORMAL LOW (ref 0.7–3.1)
Lymphs: 20 %
MCH: 26.5 pg — ABNORMAL LOW (ref 26.6–33.0)
MCHC: 34.2 g/dL (ref 31.5–35.7)
MCV: 78 fL — AB (ref 79–97)
MONOCYTES: 11 %
MONOS ABS: 0.4 10*3/uL (ref 0.1–0.9)
NEUTROS ABS: 2.2 10*3/uL (ref 1.4–7.0)
Neutrophils: 69 %
Platelets: 220 10*3/uL (ref 150–450)
RBC: 4.07 x10E6/uL (ref 3.77–5.28)
RDW: 13.5 % (ref 12.3–15.4)
RH TYPE: POSITIVE
RPR Ser Ql: NONREACTIVE
RUBELLA: 7.94 {index} (ref >0.99)
WBC: 3.2 10*3/uL — ABNORMAL LOW (ref 3.4–10.8)

## 2018-02-18 LAB — SMN1 COPY NUMBER ANALYSIS (SMA CARRIER SCREENING)

## 2018-02-18 LAB — ALPHA-THALASSEMIA

## 2018-02-18 LAB — CYSTIC FIBROSIS GENE TEST

## 2018-03-12 ENCOUNTER — Encounter: Payer: Self-pay | Admitting: Advanced Practice Midwife

## 2018-03-12 ENCOUNTER — Encounter: Payer: Self-pay | Admitting: Obstetrics and Gynecology

## 2018-03-12 ENCOUNTER — Ambulatory Visit (INDEPENDENT_AMBULATORY_CARE_PROVIDER_SITE_OTHER): Payer: Self-pay | Admitting: Advanced Practice Midwife

## 2018-03-12 VITALS — BP 123/67 | HR 86 | Wt 154.0 lb

## 2018-03-12 DIAGNOSIS — O219 Vomiting of pregnancy, unspecified: Secondary | ICD-10-CM

## 2018-03-12 DIAGNOSIS — O34219 Maternal care for unspecified type scar from previous cesarean delivery: Secondary | ICD-10-CM

## 2018-03-12 DIAGNOSIS — Z3400 Encounter for supervision of normal first pregnancy, unspecified trimester: Secondary | ICD-10-CM

## 2018-03-12 DIAGNOSIS — Z98891 History of uterine scar from previous surgery: Secondary | ICD-10-CM | POA: Insufficient documentation

## 2018-03-12 NOTE — Progress Notes (Signed)
   PRENATAL VISIT NOTE  Subjective:  Jacqueline Bowen is a 32 y.o. [id number] at 4834w1d being seen today for ongoing prenatal care.  She is currently monitored for the following issues for this low-risk pregnancy and has History of cesarean section complicating pregnancy; VBAC (vaginal birth after Cesarean); and Supervision of normal first pregnancy, antepartum on their problem list.  Patient reports no complaints.  Contractions: Not present. Vag. Bleeding: None.  Movement: Absent. Denies leaking of fluid.   The following portions of the patient's history were reviewed and updated as appropriate: allergies, current medications, past family history, past medical history, past social history, past surgical history and problem list. Problem list updated.  Objective:   Vitals:   03/12/18 0848  BP: 123/67  Pulse: 86  Weight: 154 lb (69.9 kg)    Fetal Status: Fetal Heart Rate (bpm): 148   Movement: Absent     General:  Alert, oriented and cooperative. Patient is in no acute distress.  Skin: Skin is warm and dry. No rash noted.   Cardiovascular: Normal heart rate noted  Respiratory: Normal respiratory effort, no problems with respiration noted  Abdomen: Soft, gravid, appropriate for gestational age.  Pain/Pressure: Absent     Pelvic: Cervical exam deferred        Extremities: Normal range of motion.  Edema: None  Mental Status: Normal mood and affect. Normal behavior. Normal judgment and thought content.   Assessment and Plan:  Pregnancy: [id number] at 7634w1d  1. Supervision of normal first pregnancy, antepartum --Anticipatory guidance about next visits/weeks of pregnancy given.  2. History of cesarean section complicating pregnancy --C/S x 1, then successful VBAC, desires VBAC again  3. Nausea and vomiting during pregnancy prior to [redacted] weeks gestation --Does not desire medication management. Discussed dietary changes, small frequent meals, bland foods.  4. History of VBAC   Preterm  labor symptoms and general obstetric precautions including but not limited to vaginal bleeding, contractions, leaking of fluid and fetal movement were reviewed in detail with the patient. Please refer to After Visit Summary for other counseling recommendations.  Return in about 1 month (around 04/09/2018).  No future appointments.  Sharen CounterLisa Leftwich-Kirby, CNM

## 2018-03-12 NOTE — Patient Instructions (Signed)
Morning Sickness °Morning sickness is when you feel sick to your stomach (nauseous) during pregnancy. This nauseous feeling may or may not come with vomiting. It often occurs in the morning but can be a problem any time of day. Morning sickness is most common during the first trimester, but it may continue throughout pregnancy. While morning sickness is unpleasant, it is usually harmless unless you develop severe and continual vomiting (hyperemesis gravidarum). This condition requires more intense treatment. °What are the causes? °The cause of morning sickness is not completely known but seems to be related to normal hormonal changes that occur in pregnancy. °What increases the risk? °You are at greater risk if you: °· Experienced nausea or vomiting before your pregnancy. °· Had morning sickness during a previous pregnancy. °· Are pregnant with more than one baby, such as twins. ° °How is this treated? °Do not use any medicines (prescription, over-the-counter, or herbal) for morning sickness without first talking to your health care provider. Your health care provider may prescribe or recommend: °· Vitamin B6 supplements. °· Anti-nausea medicines. °· The herbal medicine ginger. ° °Follow these instructions at home: °· Only take over-the-counter or prescription medicines as directed by your health care provider. °· Taking multivitamins before getting pregnant can prevent or decrease the severity of morning sickness in most women. °· Eat a piece of dry toast or unsalted crackers before getting out of bed in the morning. °· Eat five or six small meals a day. °· Eat dry and bland foods (rice, baked potato). Foods high in carbohydrates are often helpful. °· Do not drink liquids with your meals. Drink liquids between meals. °· Avoid greasy, fatty, and spicy foods. °· Get someone to cook for you if the smell of any food causes nausea and vomiting. °· If you feel nauseous after taking prenatal vitamins, take the vitamins at  night or with a snack. °· Snack on protein foods (nuts, yogurt, cheese) between meals if you are hungry. °· Eat unsweetened gelatins for desserts. °· Wearing an acupressure wristband (worn for sea sickness) may be helpful. °· Acupuncture may be helpful. °· Do not smoke. °· Get a humidifier to keep the air in your house free of odors. °· Get plenty of fresh air. °Contact a health care provider if: °· Your home remedies are not working, and you need medicine. °· You feel dizzy or lightheaded. °· You are losing weight. °Get help right away if: °· You have persistent and uncontrolled nausea and vomiting. °· You pass out (faint). °This information is not intended to replace advice given to you by your health care provider. Make sure you discuss any questions you have with your health care provider. °Document Released: 10/03/2006 Document Revised: 01/18/2016 Document Reviewed: 01/27/2013 °Elsevier Interactive Patient Education © 2017 Elsevier Inc. ° °

## 2018-03-19 ENCOUNTER — Other Ambulatory Visit: Payer: Self-pay | Admitting: Family Medicine

## 2018-03-19 ENCOUNTER — Ambulatory Visit (HOSPITAL_COMMUNITY)
Admission: RE | Admit: 2018-03-19 | Discharge: 2018-03-19 | Disposition: A | Discharge status: Home / Self Care | Payer: Medicaid Other | Source: Ambulatory Visit | Attending: Family Medicine | Admitting: Family Medicine

## 2018-03-19 DIAGNOSIS — Z363 Encounter for antenatal screening for malformations: Secondary | ICD-10-CM

## 2018-03-19 DIAGNOSIS — O359XX0 Maternal care for (suspected) fetal abnormality and damage, unspecified, not applicable or unspecified: Secondary | ICD-10-CM | POA: Diagnosis not present

## 2018-03-19 DIAGNOSIS — O358XX0 Maternal care for other (suspected) fetal abnormality and damage, not applicable or unspecified: Secondary | ICD-10-CM | POA: Insufficient documentation

## 2018-03-19 DIAGNOSIS — Z3A17 17 weeks gestation of pregnancy: Secondary | ICD-10-CM

## 2018-03-19 DIAGNOSIS — O34219 Maternal care for unspecified type scar from previous cesarean delivery: Secondary | ICD-10-CM | POA: Diagnosis not present

## 2018-03-19 DIAGNOSIS — Z3400 Encounter for supervision of normal first pregnancy, unspecified trimester: Secondary | ICD-10-CM

## 2018-03-20 ENCOUNTER — Other Ambulatory Visit (HOSPITAL_COMMUNITY): Payer: Self-pay | Admitting: *Deleted

## 2018-03-20 DIAGNOSIS — Z362 Encounter for other antenatal screening follow-up: Secondary | ICD-10-CM

## 2018-03-31 ENCOUNTER — Ambulatory Visit (HOSPITAL_COMMUNITY): Payer: Self-pay

## 2018-04-16 ENCOUNTER — Encounter: Payer: Self-pay | Admitting: Certified Nurse Midwife

## 2018-04-16 ENCOUNTER — Ambulatory Visit (INDEPENDENT_AMBULATORY_CARE_PROVIDER_SITE_OTHER): Payer: Medicaid Other | Admitting: Certified Nurse Midwife

## 2018-04-16 VITALS — BP 117/71 | HR 94 | Wt 163.0 lb

## 2018-04-16 DIAGNOSIS — O283 Abnormal ultrasonic finding on antenatal screening of mother: Secondary | ICD-10-CM

## 2018-04-16 DIAGNOSIS — Z3482 Encounter for supervision of other normal pregnancy, second trimester: Secondary | ICD-10-CM

## 2018-04-16 DIAGNOSIS — O34219 Maternal care for unspecified type scar from previous cesarean delivery: Secondary | ICD-10-CM

## 2018-04-16 NOTE — Progress Notes (Signed)
   PRENATAL VISIT NOTE  Subjective:  Jacqueline Bowen is a 32 y.o. [id number] at 5571w1d being seen today for ongoing prenatal care.  She is currently monitored for the following issues for this low-risk pregnancy and has History of cesarean section complicating pregnancy; Supervision of other normal pregnancy, antepartum; History of VBAC; and Echogenic intracardiac focus of fetus on prenatal ultrasound on their problem list.  Patient reports no complaints.  Contractions: Not present. Vag. Bleeding: None.  Movement: Present. Denies leaking of fluid.   The following portions of the patient's history were reviewed and updated as appropriate: allergies, current medications, past family history, past medical history, past social history, past surgical history and problem list. Problem list updated.  Objective:   Vitals:   04/16/18 1034  BP: 117/71  Pulse: 94  Weight: 163 lb (73.9 kg)    Fetal Status: Fetal Heart Rate (bpm): 152 Fundal Height: 20 cm Movement: Present     General:  Alert, oriented and cooperative. Patient is in no acute distress.  Skin: Skin is warm and dry. No rash noted.   Cardiovascular: Normal heart rate noted  Respiratory: Normal respiratory effort, no problems with respiration noted  Abdomen: Soft, gravid, appropriate for gestational age.  Pain/Pressure: Absent     Pelvic: Cervical exam deferred        Extremities: Normal range of motion.  Edema: None  Mental Status: Normal mood and affect. Normal behavior. Normal judgment and thought content.   Assessment and Plan:  Pregnancy: [id number] at 5871w1d  1. Encounter for supervision of other normal pregnancy in second trimester -Patient doing well, no complaints  -Anticipatory guidance on upcoming appointments with glucose screening at 28 weeks   2. History of cesarean section complicating pregnancy -C/Sx1 with first pregnancy, successful VBAC with last pregnancy, plans TOLAC  3. Echogenic intracardiac focus of fetus on  prenatal ultrasound -Dr Judeth CornfieldShankar discussed in detail results of US with patient and educated on EIF in normal fetuses vs. Down syndrome  -Discussed with patient options of genetic testing and amniocentesis. Patient declines additional screening and testing at this time.  -f/u US to complete anatomy scheduled for tomorrow    Preterm labor symptoms and general obstetric precautions including but not limited to vaginal bleeding, contractions, leaking of fluid and fetal movement were reviewed in detail with the patient. Please refer to After Visit Summary for other counseling recommendations.  Return in about 4 weeks (around 05/14/2018) for ROB.  Future Appointments  Date Time Provider Department Center  04/17/2018  9:15 AM [facility] US 4 [facility]  05/14/2018  9:15 AM Marny LowensteinWenzel, Julie N, PA-C [facility]    Sharyon CableVeronica C Dannelle Rhymes, CNM

## 2018-04-16 NOTE — Patient Instructions (Signed)
Second Trimester of Pregnancy The second trimester is from week 13 through week 28, month 4 through 6. This is often the time in pregnancy that you feel your best. Often times, morning sickness has lessened or quit. You may have more energy, and you may get hungry more often. Your unborn baby (fetus) is growing rapidly. At the end of the sixth month, he or she is about 9 inches long and weighs about 1 pounds. You will likely feel the baby move (quickening) between 18 and 20 weeks of pregnancy. Follow these instructions at home:  Avoid all smoking, herbs, and alcohol. Avoid drugs not approved by your doctor.  Do not use any tobacco products, including cigarettes, chewing tobacco, and electronic cigarettes. If you need help quitting, ask your doctor. You may get counseling or other support to help you quit.  Only take medicine as told by your doctor. Some medicines are safe and some are not during pregnancy.  Exercise only as told by your doctor. Stop exercising if you start having cramps.  Eat regular, healthy meals.  Wear a good support bra if your breasts are tender.  Do not use hot tubs, steam rooms, or saunas.  Wear your seat belt when driving.  Avoid raw meat, uncooked cheese, and liter boxes and soil used by cats.  Take your prenatal vitamins.  Take 1500-2000 milligrams of calcium daily starting at the 20th week of pregnancy until you deliver your baby.  Try taking medicine that helps you poop (stool softener) as needed, and if your doctor approves. Eat more fiber by eating fresh fruit, vegetables, and whole grains. Drink enough fluids to keep your pee (urine) clear or pale yellow.  Take warm water baths (sitz baths) to soothe pain or discomfort caused by hemorrhoids. Use hemorrhoid cream if your doctor approves.  If you have puffy, bulging veins (varicose veins), wear support hose. Raise (elevate) your feet for 15 minutes, 3-4 times a day. Limit salt in your diet.  Avoid heavy  lifting, wear low heals, and sit up straight.  Rest with your legs raised if you have leg cramps or low back pain.  Visit your dentist if you have not gone during your pregnancy. Use a soft toothbrush to brush your teeth. Be gentle when you floss.  You can have sex (intercourse) unless your doctor tells you not to.  Go to your doctor visits. Get help if:  You feel dizzy.  You have mild cramps or pressure in your lower belly (abdomen).  You have a nagging pain in your belly area.  You continue to feel sick to your stomach (nauseous), throw up (vomit), or have watery poop (diarrhea).  You have bad smelling fluid coming from your vagina.  You have pain with peeing (urination). Get help right away if:  You have a fever.  You are leaking fluid from your vagina.  You have spotting or bleeding from your vagina.  You have severe belly cramping or pain.  You lose or gain weight rapidly.  You have trouble catching your breath and have chest pain.  You notice sudden or extreme puffiness (swelling) of your face, hands, ankles, feet, or legs.  You have not felt the baby move in over an hour.  You have severe headaches that do not go away with medicine.  You have vision changes. This information is not intended to replace advice given to you by your health care provider. Make sure you discuss any questions you have with your health care   provider. Document Released: 11/06/2009 Document Revised: 01/18/2016 Document Reviewed: 10/13/2012 Elsevier Interactive Patient Education  2017 Elsevier Inc.  

## 2018-04-17 ENCOUNTER — Encounter (HOSPITAL_COMMUNITY): Payer: Self-pay

## 2018-04-17 ENCOUNTER — Ambulatory Visit (HOSPITAL_COMMUNITY)
Admission: RE | Admit: 2018-04-17 | Discharge: 2018-04-17 | Disposition: A | Discharge status: Home / Self Care | Payer: Medicaid Other | Source: Ambulatory Visit | Attending: Family Medicine | Admitting: Family Medicine

## 2018-04-17 DIAGNOSIS — O34219 Maternal care for unspecified type scar from previous cesarean delivery: Secondary | ICD-10-CM

## 2018-04-17 DIAGNOSIS — Z362 Encounter for other antenatal screening follow-up: Secondary | ICD-10-CM | POA: Insufficient documentation

## 2018-04-17 DIAGNOSIS — Z3A21 21 weeks gestation of pregnancy: Secondary | ICD-10-CM | POA: Diagnosis not present

## 2018-04-17 DIAGNOSIS — O359XX0 Maternal care for (suspected) fetal abnormality and damage, unspecified, not applicable or unspecified: Secondary | ICD-10-CM

## 2018-05-14 ENCOUNTER — Encounter: Payer: Self-pay | Admitting: Medical

## 2018-05-14 ENCOUNTER — Ambulatory Visit (INDEPENDENT_AMBULATORY_CARE_PROVIDER_SITE_OTHER): Payer: Medicaid Other | Admitting: Medical

## 2018-05-14 DIAGNOSIS — Z3482 Encounter for supervision of other normal pregnancy, second trimester: Secondary | ICD-10-CM

## 2018-05-14 DIAGNOSIS — Z3480 Encounter for supervision of other normal pregnancy, unspecified trimester: Secondary | ICD-10-CM

## 2018-05-14 NOTE — Progress Notes (Signed)
   PRENATAL VISIT NOTE  Subjective:  Jacqueline Bowen is a 32 y.o. [id number] at 5655w1d being seen today for ongoing prenatal care.  She is currently monitored for the following issues for this low-risk pregnancy and has History of cesarean section complicating pregnancy; Supervision of other normal pregnancy, antepartum; History of VBAC; and Echogenic intracardiac focus of fetus on prenatal ultrasound on their problem list.  Patient reports no complaints.  Contractions: Not present. Vag. Bleeding: None.  Movement: Present. Denies leaking of fluid.   The following portions of the patient's history were reviewed and updated as appropriate: allergies, current medications, past family history, past medical history, past social history, past surgical history and problem list. Problem list updated.  Objective:   Vitals:   05/14/18 0920  BP: 123/74  Pulse: 97  Weight: 167 lb 9.6 oz (76 kg)    Fetal Status: Fetal Heart Rate (bpm): 150 Fundal Height: 25 cm Movement: Present     General:  Alert, oriented and cooperative. Patient is in no acute distress.  Skin: Skin is warm and dry. No rash noted.   Cardiovascular: Normal heart rate noted  Respiratory: Normal respiratory effort, no problems with respiration noted  Abdomen: Soft, gravid, appropriate for gestational age.  Pain/Pressure: Absent     Pelvic: Cervical exam deferred        Extremities: Normal range of motion.  Edema: None  Mental Status: Normal mood and affect. Normal behavior. Normal judgment and thought content.   Assessment and Plan:  Pregnancy: [id number] at 7755w1d  1. Supervision of other normal pregnancy, antepartum - Doing well, no complaints - US and prenatal labs reviewed - Planning for 2 hour GTT, CBC, HIV, RPR, TDap and Flu shot at next visit   Preterm labor symptoms and general obstetric precautions including but not limited to vaginal bleeding, contractions, leaking of fluid and fetal movement were reviewed in detail with  the patient. Please refer to After Visit Summary for other counseling recommendations.  Return in about 4 weeks (around 06/11/2018) for LOB, 28 week labs (fasting).  Vonzella NippleJulie Myking Sar, PA-C

## 2018-05-14 NOTE — Patient Instructions (Signed)
Second Trimester of Pregnancy The second trimester is from week 13 through week 28, month 4 through 6. This is often the time in pregnancy that you feel your best. Often times, morning sickness has lessened or quit. You may have more energy, and you may get hungry more often. Your unborn baby (fetus) is growing rapidly. At the end of the sixth month, he or she is about 9 inches long and weighs about 1 pounds. You will likely feel the baby move (quickening) between 18 and 20 weeks of pregnancy.  Research childbirth classes and hospital preregistration at [email or web address]  Follow these instructions at home:  Avoid all smoking, herbs, and alcohol. Avoid drugs not approved by your doctor.  Do not use any tobacco products, including cigarettes, chewing tobacco, and electronic cigarettes. If you need help quitting, ask your doctor. You may get counseling or other support to help you quit.  Only take medicine as told by your doctor. Some medicines are safe and some are not during pregnancy.  Exercise only as told by your doctor. Stop exercising if you start having cramps.  Eat regular, healthy meals.  Wear a good support bra if your breasts are tender.  Do not use hot tubs, steam rooms, or saunas.  Wear your seat belt when driving.  Avoid raw meat, uncooked cheese, and liter boxes and soil used by cats.  Take your prenatal vitamins.  Take 1500-2000 milligrams of calcium daily starting at the 20th week of pregnancy until you deliver your baby.  Try taking medicine that helps you poop (stool softener) as needed, and if your doctor approves. Eat more fiber by eating fresh fruit, vegetables, and whole grains. Drink enough fluids to keep your pee (urine) clear or pale yellow.  Take warm water baths (sitz baths) to soothe pain or discomfort caused by hemorrhoids. Use hemorrhoid cream if your doctor approves.  If you have puffy, bulging veins (varicose veins), wear support hose. Raise  (elevate) your feet for 15 minutes, 3-4 times a day. Limit salt in your diet.  Avoid heavy lifting, wear low heals, and sit up straight.  Rest with your legs raised if you have leg cramps or low back pain.  Visit your dentist if you have not gone during your pregnancy. Use a soft toothbrush to brush your teeth. Be gentle when you floss.  You can have sex (intercourse) unless your doctor tells you not to.  Go to your doctor visits.  Get help if:  You feel dizzy.  You have mild cramps or pressure in your lower belly (abdomen).  You have a nagging pain in your belly area.  You continue to feel sick to your stomach (nauseous), throw up (vomit), or have watery poop (diarrhea).  You have bad smelling fluid coming from your vagina.  You have pain with peeing (urination). Get help right away if:  You have a fever.  You are leaking fluid from your vagina.  You have spotting or bleeding from your vagina.  You have severe belly cramping or pain.  You lose or gain weight rapidly.  You have trouble catching your breath and have chest pain.  You notice sudden or extreme puffiness (swelling) of your face, hands, ankles, feet, or legs.  You have not felt the baby move in over an hour.  You have severe headaches that do not go away with medicine.  You have vision changes. This information is not intended to replace advice given to you by your health care provider. Make   sure you discuss any questions you have with your health care provider. Document Released: 11/06/2009 Document Revised: 01/18/2016 Document Reviewed: 10/13/2012 Elsevier Interactive Patient Education  2017 Elsevier Inc.    

## 2018-05-18 ENCOUNTER — Encounter: Payer: Self-pay | Admitting: *Deleted

## 2018-06-12 ENCOUNTER — Other Ambulatory Visit: Payer: Self-pay | Admitting: *Deleted

## 2018-06-12 ENCOUNTER — Encounter: Payer: Self-pay | Admitting: Obstetrics & Gynecology

## 2018-06-12 ENCOUNTER — Other Ambulatory Visit: Payer: Self-pay

## 2018-06-12 DIAGNOSIS — Z3480 Encounter for supervision of other normal pregnancy, unspecified trimester: Secondary | ICD-10-CM

## 2018-06-15 ENCOUNTER — Encounter: Payer: Self-pay | Admitting: Advanced Practice Midwife

## 2018-06-15 ENCOUNTER — Ambulatory Visit (INDEPENDENT_AMBULATORY_CARE_PROVIDER_SITE_OTHER): Payer: Medicaid Other | Admitting: Advanced Practice Midwife

## 2018-06-15 ENCOUNTER — Other Ambulatory Visit: Payer: Medicaid Other

## 2018-06-15 VITALS — BP 113/76 | HR 93 | Wt 173.0 lb

## 2018-06-15 DIAGNOSIS — Z23 Encounter for immunization: Secondary | ICD-10-CM

## 2018-06-15 DIAGNOSIS — Z3480 Encounter for supervision of other normal pregnancy, unspecified trimester: Secondary | ICD-10-CM

## 2018-06-15 NOTE — Progress Notes (Signed)
   PRENATAL VISIT NOTE  Subjective:  Jacqueline Bowen is a 32 y.o. [id number] at [redacted]w[redacted]d being seen today for ongoing prenatal care.  She is currently monitored for the following issues for this low-risk pregnancy and has History of cesarean section complicating pregnancy; Supervision of other normal pregnancy, antepartum; History of VBAC; and Echogenic intracardiac focus of fetus on prenatal ultrasound on their problem list.  Patient reports no complaints.  Contractions: Not present. Vag. Bleeding: None.  Movement: Present. Denies leaking of fluid.   The following portions of the patient's history were reviewed and updated as appropriate: allergies, current medications, past family history, past medical history, past social history, past surgical history and problem list. Problem list updated.  Objective:   Vitals:   06/15/18 0824  BP: 113/76  Pulse: 93  Weight: 173 lb (78.5 kg)    Fetal Status: Fetal Heart Rate (bpm): 152   Movement: Present     General:  Alert, oriented and cooperative. Patient is in no acute distress.  Skin: Skin is warm and dry. No rash noted.   Cardiovascular: Normal heart rate noted  Respiratory: Normal respiratory effort, no problems with respiration noted  Abdomen: Soft, gravid, appropriate for gestational age.  Pain/Pressure: Absent     Pelvic: Cervical exam deferred        Extremities: Normal range of motion.  Edema: None  Mental Status: Normal mood and affect. Normal behavior. Normal judgment and thought content.   Assessment and Plan:  Pregnancy: [id number] at [redacted]w[redacted]d  1. Supervision of other normal pregnancy, antepartum - GTT and 28 week labs today - Unsure about flu vax today - VBAC consent signed   2. Need for Tdap vaccination - Tdap vaccine greater than or equal to 7yo IM  Preterm labor symptoms and general obstetric precautions including but not limited to vaginal bleeding, contractions, leaking of fluid and fetal movement were reviewed in detail  with the patient. Please refer to After Visit Summary for other counseling recommendations.  Return in about 2 weeks (around 06/29/2018).  No future appointments.  Thressa Sheller, CNM

## 2018-06-15 NOTE — Patient Instructions (Signed)
Trial of Labor After Cesarean Delivery A trial of labor after cesarean delivery (TOLAC) is when a woman tries to give birth vaginally after a previous cesarean delivery. TOLAC may be a safe and appropriate option for you depending on your medical history and other risk factors. When TOLAC is successful and you are able to have a vaginal delivery, this is called a vaginal birth after cesarean delivery (VBAC). Candidates for TOLAC TOLAC is possible for some women who:  Have undergone one or two prior cesarean deliveries in which the incision of the uterus was horizontal (low transverse).  Are carrying twins and have had one prior low transverse incision during a cesarean delivery.  Do not have a vertical (classical) uterine scar.  Have not had a tear in the wall of their uterus (uterine rupture).  TOLAC is also supported for women who meet appropriate criteria and:  Are under the age of 40 years.  Are tall and have a body mass index (BMI) of less than 30.  Have an unknown uterine scar.  Give birth in a facility equipped to handle an emergency cesarean delivery. This team should be able to handle possible complications such as a uterine rupture.  Have thorough counseling about the benefits and risks of TOLAC.  Have discussed future pregnancy plans with their health care provider.  Plan to have several more pregnancies.  Most successful candidates for TOLAC:  Have had a successful vaginal delivery before or after their cesarean delivery.  Experience labor that begins naturally on or before the due date (40 weeks of gestation).  Do not have a very large (macrosomic) baby.  Had a prior cesarean delivery but are not currently experiencing factors that would prompt a cesarean delivery (such as a breech position).  Had only one prior cesarean delivery.  Had a prior cesarean delivery that was performed early in labor and not after full cervical dilation. TOLAC may be most appropriate  for women who meet the above guidelines and who plan to have more pregnancies. TOLAC is not recommended for home births. Least successful candidates for TOLAC:  Have an induced labor with an unfavorable cervix. An unfavorable cervix is when the cervix is not dilating enough (among other factors).  Have never had a vaginal delivery.  Have had more than two cesarean deliveries.  Have a pregnancy at more than 40 weeks of gestation.  Are pregnant with a baby with a suspected weight greater than 4,000 grams (8 pounds) and who have no prior history of a vaginal delivery.  Have closely spaced pregnancies. Suggested benefits of TOLAC  You may have a faster recovery time.  You may have a shorter stay in the hospital.  You may have less pain and fewer problems than with a cesarean delivery. Women who have a cesarean delivery have a higher chance of needing blood or getting a fever, an infection, or a blood clot in the legs. Suggested risks of TOLAC The highest risk of complications happens to women who attempt a TOLAC and fail. A failed TOLAC results in an unplanned cesarean delivery. Risks related to TOLAC or repeat cesarean deliveries include:  Blood loss.  Infection.  Blood clot.  Injury to surrounding tissues or organs.  Having to remove the uterus (hysterectomy).  Potential problems with the placenta (such as placenta previa or placenta accreta) in future pregnancies.  Although very rare, the main concerns with TOLAC are:  Rupture of the uterine scar from a past cesarean delivery.  Needing an emergency   cesarean delivery.  Having a bad outcome for the baby (perinatal morbidity).  Where to find more information:  American Congress of Obstetricians and Gynecologists: www.acog.org  American College of Nurse-Midwives: www.midwife.org This information is not intended to replace advice given to you by your health care provider. Make sure you discuss any questions you have with  your health care provider. Document Released: 04/30/2011 Document Revised: 07/10/2016 Document Reviewed: 02/01/2013 Elsevier Interactive Patient Education  2018 Elsevier Inc.  

## 2018-06-16 ENCOUNTER — Encounter: Payer: Self-pay | Admitting: *Deleted

## 2018-06-16 LAB — CBC
Hematocrit: 32.7 % — ABNORMAL LOW (ref 34.0–46.6)
Hemoglobin: 11 g/dL — ABNORMAL LOW (ref 11.1–15.9)
MCH: 28.3 pg (ref 26.6–33.0)
MCHC: 33.6 g/dL (ref 31.5–35.7)
MCV: 84 fL (ref 79–97)
PLATELETS: 188 10*3/uL (ref 150–450)
RBC: 3.89 x10E6/uL (ref 3.77–5.28)
RDW: 12.7 % (ref 12.3–15.4)
WBC: 5 10*3/uL (ref 3.4–10.8)

## 2018-06-16 LAB — HIV ANTIBODY (ROUTINE TESTING W REFLEX): HIV SCREEN 4TH GENERATION: NONREACTIVE

## 2018-06-16 LAB — RPR: RPR: NONREACTIVE

## 2018-06-16 LAB — GLUCOSE TOLERANCE, 2 HOURS W/ 1HR
GLUCOSE, FASTING: 79 mg/dL (ref 65–91)
Glucose, 1 hour: 99 mg/dL (ref 65–179)
Glucose, 2 hour: 80 mg/dL (ref 65–152)

## 2018-07-03 ENCOUNTER — Encounter: Payer: Self-pay | Admitting: Student

## 2018-07-03 ENCOUNTER — Ambulatory Visit (INDEPENDENT_AMBULATORY_CARE_PROVIDER_SITE_OTHER): Payer: Medicaid Other | Admitting: Student

## 2018-07-03 VITALS — BP 129/76 | HR 100 | Wt 183.0 lb

## 2018-07-03 DIAGNOSIS — Z3483 Encounter for supervision of other normal pregnancy, third trimester: Secondary | ICD-10-CM

## 2018-07-03 DIAGNOSIS — Z3480 Encounter for supervision of other normal pregnancy, unspecified trimester: Secondary | ICD-10-CM

## 2018-07-03 NOTE — Patient Instructions (Addendum)

## 2018-07-03 NOTE — Progress Notes (Signed)
   PRENATAL VISIT NOTE  Subjective:  Jacqueline Bowen is a 32 y.o. [id number] at [redacted]w[redacted]d being seen today for ongoing prenatal care.  She is currently monitored for the following issues for this low-risk pregnancy and has History of cesarean section complicating pregnancy; Supervision of other normal pregnancy, antepartum; History of VBAC; Echogenic intracardiac focus of fetus on prenatal ultrasound; and Left thyroid nodule on their problem list.  Patient reports no complaints.  Contractions: Not present. Vag. Bleeding: None.  Movement: Present. Denies leaking of fluid.   The following portions of the patient's history were reviewed and updated as appropriate: allergies, current medications, past family history, past medical history, past social history, past surgical history and problem list. Problem list updated.  Objective:   Vitals:   07/03/18 0829  BP: 129/76  Pulse: 100  Weight: 183 lb (83 kg)    Fetal Status: Fetal Heart Rate (bpm): 148 Fundal Height: 30 cm Movement: Present     General:  Alert, oriented and cooperative. Patient is in no acute distress.  Skin: Skin is warm and dry. No rash noted.   Cardiovascular: Normal heart rate noted  Respiratory: Normal respiratory effort, no problems with respiration noted  Abdomen: Soft, gravid, appropriate for gestational age.  Pain/Pressure: Absent     Pelvic: Cervical exam deferred        Extremities: Normal range of motion.  Edema: None  Mental Status: Normal mood and affect. Normal behavior. Normal judgment and thought content.   Assessment and Plan:  Pregnancy: [id number] at [redacted]w[redacted]d  1. Supervision of other normal pregnancy, antepartum -considering IUD vs nexplanon for contraception.  -watch fundal height next visit & consider growth ultrasound -reviewed labs from last visit  Preterm labor symptoms and general obstetric precautions including but not limited to vaginal bleeding, contractions, leaking of fluid and fetal movement were  reviewed in detail with the patient. Please refer to After Visit Summary for other counseling recommendations.  Return in about 2 weeks (around 07/17/2018) for Routine OB.  Future Appointments  Date Time Provider Department Center  07/17/2018  8:15 AM Judeth Horn, NP [facility]  07/31/2018  8:15 AM Judeth Horn, NP [facility]  08/07/2018  8:15 AM Judeth Horn, NP [facility]  08/21/2018  8:55 AM Judeth Horn, NP [facility]    Judeth Horn, NP

## 2018-07-17 ENCOUNTER — Ambulatory Visit (INDEPENDENT_AMBULATORY_CARE_PROVIDER_SITE_OTHER): Payer: Medicaid Other | Admitting: Student

## 2018-07-17 DIAGNOSIS — Z3A34 34 weeks gestation of pregnancy: Secondary | ICD-10-CM

## 2018-07-17 DIAGNOSIS — Z3483 Encounter for supervision of other normal pregnancy, third trimester: Secondary | ICD-10-CM

## 2018-07-17 DIAGNOSIS — Z3480 Encounter for supervision of other normal pregnancy, unspecified trimester: Secondary | ICD-10-CM

## 2018-07-17 NOTE — Progress Notes (Signed)
   PRENATAL VISIT NOTE  Subjective:  Jacqueline Bowen is a 32 y.o. [id number] at 2362w2d being seen today for ongoing prenatal care.  She is currently monitored for the following issues for this low-risk pregnancy and has History of cesarean section complicating pregnancy; Supervision of other normal pregnancy, antepartum; History of VBAC; Echogenic intracardiac focus of fetus on prenatal ultrasound; and Left thyroid nodule on their problem list.  Patient reports no complaints.  Contractions: Not present. Vag. Bleeding: None.  Movement: Present. Denies leaking of fluid.   The following portions of the patient's history were reviewed and updated as appropriate: allergies, current medications, past family history, past medical history, past social history, past surgical history and problem list. Problem list updated.  Objective:   Vitals:   07/17/18 0832  BP: 117/72  Pulse: 98  Weight: 181 lb 1.6 oz (82.1 kg)    Fetal Status: Fetal Heart Rate (bpm): 145 Fundal Height: 33 cm Movement: Present     General:  Alert, oriented and cooperative. Patient is in no acute distress.  Skin: Skin is warm and dry. No rash noted.   Cardiovascular: Normal heart rate noted  Respiratory: Normal respiratory effort, no problems with respiration noted  Abdomen: Soft, gravid, appropriate for gestational age.  Pain/Pressure: Absent     Pelvic: Cervical exam deferred        Extremities: Normal range of motion.  Edema: None  Mental Status: Normal mood and affect. Normal behavior. Normal judgment and thought content.   Assessment and Plan:  Pregnancy: [id number] at 762w2d  1. Supervision of other normal pregnancy, antepartum -doing well -discussed upcoming visits  Preterm labor symptoms and general obstetric precautions including but not limited to vaginal bleeding, contractions, leaking of fluid and fetal movement were reviewed in detail with the patient. Please refer to After Visit Summary for other counseling  recommendations.  Return in about 2 weeks (around 07/31/2018) for Routine OB.  Future Appointments  Date Time Provider Department Center  07/31/2018  8:15 AM Judeth HornLawrence, Sharri Loya, [facility]  08/07/2018  8:15 AM Jud[facility] HornLawrence, [facility] Christon[facility]  08/21/2018  8:55 AM Judeth HornLawrence, Almedia Cordell[facility]    Judeth HornErin Sundance Moise, NP

## 2018-07-17 NOTE — Patient Instructions (Signed)

## 2018-07-31 ENCOUNTER — Encounter: Payer: Self-pay | Admitting: Student

## 2018-07-31 ENCOUNTER — Other Ambulatory Visit (HOSPITAL_COMMUNITY)
Admission: RE | Admit: 2018-07-31 | Discharge: 2018-07-31 | Disposition: A | Discharge status: Home / Self Care | Payer: Medicaid Other | Source: Ambulatory Visit | Attending: Student | Admitting: Student

## 2018-07-31 ENCOUNTER — Ambulatory Visit (INDEPENDENT_AMBULATORY_CARE_PROVIDER_SITE_OTHER): Payer: Medicaid Other | Admitting: Student

## 2018-07-31 VITALS — BP 126/71 | HR 101 | Wt 183.7 lb

## 2018-07-31 DIAGNOSIS — Z3A36 36 weeks gestation of pregnancy: Secondary | ICD-10-CM

## 2018-07-31 DIAGNOSIS — Z3480 Encounter for supervision of other normal pregnancy, unspecified trimester: Secondary | ICD-10-CM | POA: Diagnosis not present

## 2018-07-31 DIAGNOSIS — Z3483 Encounter for supervision of other normal pregnancy, third trimester: Secondary | ICD-10-CM

## 2018-07-31 NOTE — Progress Notes (Signed)
   PRENATAL VISIT NOTE  Subjective:  Jacqueline Bowen is a 32 y.o. [id number] at 5349w2d being seen today for ongoing prenatal care.  She is currently monitored for the following issues for this low-risk pregnancy and has History of cesarean section complicating pregnancy; Supervision of other normal pregnancy, antepartum; History of VBAC; Echogenic intracardiac focus of fetus on prenatal ultrasound; and Left thyroid nodule on their problem list.  Patient reports no complaints.  Contractions: Not present. Vag. Bleeding: None.  Movement: Present. Denies leaking of fluid.   The following portions of the patient's history were reviewed and updated as appropriate: allergies, current medications, past family history, past medical history, past social history, past surgical history and problem list. Problem list updated.  Objective:   Vitals:   07/31/18 0841  BP: 126/71  Pulse: (!) 101  Weight: 183 lb 11.2 oz (83.3 kg)    Fetal Status: Fetal Heart Rate (bpm): 139 Fundal Height: 35 cm Movement: Present  Presentation: Vertex  General:  Alert, oriented and cooperative. Patient is in no acute distress.  Skin: Skin is warm and dry. No rash noted.   Cardiovascular: Normal heart rate noted  Respiratory: Normal respiratory effort, no problems with respiration noted  Abdomen: Soft, gravid, appropriate for gestational age.  Pain/Pressure: Present     Pelvic: Cervical exam deferred        Extremities: Normal range of motion.  Edema: None  Mental Status: Normal mood and affect. Normal behavior. Normal judgment and thought content.   Assessment and Plan:  Pregnancy: [id number] at 4549w2d  1. Supervision of other normal pregnancy, antepartum  - GC[facility]Chlamydia probe amp [facility] - Culture, beta strep (group b only)  Term labor symptoms and general obstetric precautions including but not limited to vaginal bleeding, contractions, leaking of fluid and fetal movement were reviewed in detail with  the patient. Please refer to After Visit Summary for other counseling recommendations.  Return in about 1 week (around 08/07/2018) for Routine OB.  Future Appointments  Date Time Provider Department Center  08/07/2018  8:15 AM Judeth HornLawrence, Quincey Quesinberry, [facility]  08/21/2018  8:55 AM Judeth HornLawrence, Altair Stanko, [facility]    Judeth HornErin Stephaun Million, NP

## 2018-07-31 NOTE — Patient Instructions (Signed)
Vaginal Birth After Cesarean Delivery Vaginal birth after cesarean delivery (VBAC) is giving birth vaginally after previously delivering a baby by a cesarean. In the past, if a woman had a cesarean delivery, all births afterward would be done by cesarean delivery. This is no longer true. It can be safe for the mother to try a vaginal delivery after having a cesarean delivery. It is important to discuss VBAC with your health care provider early in the pregnancy so you can understand the risks, benefits, and options. It will give you time to decide what is best in your particular case. The final decision about whether to have a VBAC or repeat cesarean delivery should be between you and your health care provider. Any changes in your health or your baby's health during your pregnancy may make it necessary to change your initial decision about VBAC. Women who plan to have a VBAC should check with their health care provider to be sure that:  The previous cesarean delivery was done with a low transverse uterine cut (incision) (not a vertical classical incision).  The birth canal is big enough for the baby.  There were no other operations on the uterus.  An electronic fetal monitor (EFM) will be on at all times during labor.  An operating room will be available and ready in case an emergency cesarean delivery is needed.  A health care provider and surgical nursing staff will be available at all times during labor to be ready to do an emergency delivery cesarean if necessary.  An anesthesiologist will be present in case an emergency cesarean delivery is needed.  The nursery is prepared and has adequate personnel and necessary equipment available to care for the baby in case of an emergency cesarean delivery. Benefits of VBAC  Shorter stay in the hospital.  Avoidance of risks associated with cesarean delivery, such as: ? Surgical complications, such as opening of the incision or hernia in the  incision. ? Injury to other organs. ? Fever. This can occur if an infection develops after surgery. It can also occur as a reaction to the medicine given to make you numb during the surgery.  Less blood loss and need for blood transfusions.  Lower risk of blood clots and infection.  Shorter recovery.  Decreased risk for having to remove the uterus (hysterectomy).  Decreased risk for the placenta to completely or partially cover the opening of the uterus (placenta previa) with a future pregnancy.  Decrease risk in future labor and delivery. Risks of a VBAC  Tearing (rupture) of the uterus. This is occurs in less than 1% of VBACs. The risk of this happening is higher if: ? Steps are taken to begin the labor process (induce labor) or stimulate or strengthen contractions (augment labor). ? Medicine is used to soften (ripen) the cervix.  Having to remove the uterus (hysterectomy) if it ruptures. VBAC should not be done if:  The previous cesarean delivery was done with a vertical (classical) or T-shaped incision or you do not know what kind of incision was made.  You had a ruptured uterus.  You have had certain types of surgery on your uterus, such as removal of uterine fibroids. Ask your health care provider about other types of surgeries that prevent you from having a VBAC.  You have certain medical or childbirth (obstetrical) problems.  There are problems with the baby.  You have had two previous cesarean deliveries and no vaginal deliveries. Other facts to know about VBAC:  It   is safe to have an epidural anesthetic with VBAC.  It is safe to turn the baby from a breech position (attempt an external cephalic version).  It is safe to try a VBAC with twins.  VBAC may not be successful if your baby weights 8.8 lb (4 kg) or more. However, weight predictions are not always accurate and should not be used alone to decide if VBAC is right for you.  There is an increased failure rate  if the time between the cesarean delivery and VBAC is less than 19 months.  Your health care provider may advise against a VBAC if you have preeclampsia (high blood pressure, protein in the urine, and swelling of face and extremities).  VBAC is often successful if you previously gave birth vaginally.  VBAC is often successful when the labor starts spontaneously before the due date.  Delivering a baby through a VBAC is similar to having a normal spontaneous vaginal delivery. This information is not intended to replace advice given to you by your health care provider. Make sure you discuss any questions you have with your health care provider. Document Released: 02/02/2007 Document Revised: 01/18/2016 Document Reviewed: 03/11/2013 Elsevier Interactive Patient Education  2018 Elsevier Inc.  

## 2018-08-03 LAB — GC/CHLAMYDIA PROBE AMP (~~LOC~~) NOT AT ARMC
Chlamydia: NEGATIVE
Neisseria Gonorrhea: NEGATIVE

## 2018-08-04 LAB — CULTURE, BETA STREP (GROUP B ONLY): Strep Gp B Culture: NEGATIVE

## 2018-08-07 ENCOUNTER — Ambulatory Visit (INDEPENDENT_AMBULATORY_CARE_PROVIDER_SITE_OTHER): Payer: Medicaid Other | Admitting: Student

## 2018-08-07 DIAGNOSIS — Z3480 Encounter for supervision of other normal pregnancy, unspecified trimester: Secondary | ICD-10-CM

## 2018-08-07 DIAGNOSIS — Z3483 Encounter for supervision of other normal pregnancy, third trimester: Secondary | ICD-10-CM

## 2018-08-07 NOTE — Progress Notes (Signed)
   PRENATAL VISIT NOTE  Subjective:  Jacqueline Bowen is a 32 y.o. [id number] at 4727w2d being seen today for ongoing prenatal care.  She is currently monitored for the following issues for this low-risk pregnancy and has History of cesarean section complicating pregnancy; Supervision of other normal pregnancy, antepartum; History of VBAC; and Left thyroid nodule on their problem list.  Patient reports no complaints.  Contractions: Not present. Vag. Bleeding: None.  Movement: Present. Denies leaking of fluid.   The following portions of the patient's history were reviewed and updated as appropriate: allergies, current medications, past family history, past medical history, past social history, past surgical history and problem list. Problem list updated.  Objective:   Vitals:   08/07/18 0831  BP: 132/72  Pulse: 99  Weight: 187 lb 11.2 oz (85.1 kg)    Fetal Status: Fetal Heart Rate (bpm): 147 Fundal Height: 36 cm Movement: Present     General:  Alert, oriented and cooperative. Patient is in no acute distress.  Skin: Skin is warm and dry. No rash noted.   Cardiovascular: Normal heart rate noted  Respiratory: Normal respiratory effort, no problems with respiration noted  Abdomen: Soft, gravid, appropriate for gestational age.  Pain/Pressure: Present     Pelvic: Cervical exam deferred        Extremities: Normal range of motion.  Edema: None  Mental Status: Normal mood and affect. Normal behavior. Normal judgment and thought content.   Assessment and Plan:  Pregnancy: [id number] at 5427w2d  1. Supervision of other normal pregnancy, antepartum -doing well  Term labor symptoms and general obstetric precautions including but not limited to vaginal bleeding, contractions, leaking of fluid and fetal movement were reviewed in detail with the patient. Please refer to After Visit Summary for other counseling recommendations.  Return in about 1 week (around 08/14/2018) for Routine OB.  Future  Appointments  Date Time Provider Department Center  08/21/2018  8:55 AM Judeth HornLawrence, Tykel Badie, NP [facility]    Judeth HornErin Loreto Loescher, NP

## 2018-08-07 NOTE — Patient Instructions (Signed)
Contraception Choices Contraception, also called birth control, refers to methods or devices that prevent pregnancy. Hormonal methods Contraceptive implant A contraceptive implant is a thin, plastic tube that contains a hormone. It is inserted into the upper part of the arm. It can remain in place for up to 3 years. Progestin-only injections Progestin-only injections are injections of progestin, a synthetic form of the hormone progesterone. They are given every 3 months by a health care provider. Birth control pills Birth control pills are pills that contain hormones that prevent pregnancy. They must be taken once a day, preferably at the same time each day. Birth control patch The birth control patch contains hormones that prevent pregnancy. It is placed on the skin and must be changed once a week for three weeks and removed on the fourth week. A prescription is needed to use this method of contraception. Vaginal ring A vaginal ring contains hormones that prevent pregnancy. It is placed in the vagina for three weeks and removed on the fourth week. After that, the process is repeated with a new ring. A prescription is needed to use this method of contraception. Emergency contraceptive Emergency contraceptives prevent pregnancy after unprotected sex. They come in pill form and can be taken up to 5 days after sex. They work best the sooner they are taken after having sex. Most emergency contraceptives are available without a prescription. This method should not be used as your only form of birth control. Barrier methods Female condom A female condom is a thin sheath that is worn over the penis during sex. Condoms keep sperm from going inside a woman's body. They can be used with a spermicide to increase their effectiveness. They should be disposed after a single use. Female condom A female condom is a soft, loose-fitting sheath that is put into the vagina before sex. The condom keeps sperm from going  inside a woman's body. They should be disposed after a single use. Diaphragm A diaphragm is a soft, dome-shaped barrier. It is inserted into the vagina before sex, along with a spermicide. The diaphragm blocks sperm from entering the uterus, and the spermicide kills sperm. A diaphragm should be left in the vagina for 6-8 hours after sex and removed within 24 hours. A diaphragm is prescribed and fitted by a health care provider. A diaphragm should be replaced every 1-2 years, after giving birth, after gaining more than 15 lb (6.8 kg), and after pelvic surgery. Cervical cap A cervical cap is a round, soft latex or plastic cup that fits over the cervix. It is inserted into the vagina before sex, along with spermicide. It blocks sperm from entering the uterus. The cap should be left in place for 6-8 hours after sex and removed within 48 hours. A cervical cap must be prescribed and fitted by a health care provider. It should be replaced every 2 years. Sponge A sponge is a soft, circular piece of polyurethane foam with spermicide on it. The sponge helps block sperm from entering the uterus, and the spermicide kills sperm. To use it, you make it wet and then insert it into the vagina. It should be inserted before sex, left in for at least 6 hours after sex, and removed and thrown away within 30 hours. Spermicides Spermicides are chemicals that kill or block sperm from entering the cervix and uterus. They can come as a cream, jelly, suppository, foam, or tablet. A spermicide should be inserted into the vagina with an applicator at least 10-15 minutes before   sex to allow time for it to work. The process must be repeated every time you have sex. Spermicides do not require a prescription. Intrauterine contraception Intrauterine device (IUD) An IUD is a T-shaped device that is put in a woman's uterus. There are two types:  Hormone IUD.This type contains progestin, a synthetic form of the hormone progesterone. This  type can stay in place for 3-5 years.  Copper IUD.This type is wrapped in copper wire. It can stay in place for 10 years.  Permanent methods of contraception Female tubal ligation In this method, a woman's fallopian tubes are sealed, tied, or blocked during surgery to prevent eggs from traveling to the uterus. Hysteroscopic sterilization In this method, a small, flexible insert is placed into each fallopian tube. The inserts cause scar tissue to form in the fallopian tubes and block them, so sperm cannot reach an egg. The procedure takes about 3 months to be effective. Another form of birth control must be used during those 3 months. Female sterilization This is a procedure to tie off the tubes that carry sperm (vasectomy). After the procedure, the man can still ejaculate fluid (semen). Natural planning methods Natural family planning In this method, a couple does not have sex on days when the woman could become pregnant. Calendar method This means keeping track of the length of each menstrual cycle, identifying the days when pregnancy can happen, and not having sex on those days. Ovulation method In this method, a couple avoids sex during ovulation. Symptothermal method This method involves not having sex during ovulation. The woman typically checks for ovulation by watching changes in her temperature and in the consistency of cervical mucus. Post-ovulation method In this method, a couple waits to have sex until after ovulation. Summary  Contraception, also called birth control, means methods or devices that prevent pregnancy.  Hormonal methods of contraception include implants, injections, pills, patches, vaginal rings, and emergency contraceptives.  Barrier methods of contraception can include female condoms, female condoms, diaphragms, cervical caps, sponges, and spermicides.  There are two types of IUDs (intrauterine devices). An IUD can be put in a woman's uterus to prevent pregnancy  for 3-5 years.  Permanent sterilization can be done through a procedure for males, females, or both.  Natural family planning methods involve not having sex on days when the woman could become pregnant. This information is not intended to replace advice given to you by your health care provider. Make sure you discuss any questions you have with your health care provider. Document Released: 08/12/2005 Document Revised: 09/14/2016 Document Reviewed: 09/14/2016 Elsevier Interactive Patient Education  2018 Elsevier Inc.  

## 2018-08-21 ENCOUNTER — Ambulatory Visit (INDEPENDENT_AMBULATORY_CARE_PROVIDER_SITE_OTHER): Payer: Medicaid Other | Admitting: Student

## 2018-08-21 VITALS — BP 115/69 | HR 92 | Wt 187.3 lb

## 2018-08-21 DIAGNOSIS — O34219 Maternal care for unspecified type scar from previous cesarean delivery: Secondary | ICD-10-CM

## 2018-08-21 DIAGNOSIS — Z3480 Encounter for supervision of other normal pregnancy, unspecified trimester: Secondary | ICD-10-CM

## 2018-08-21 DIAGNOSIS — Z3483 Encounter for supervision of other normal pregnancy, third trimester: Secondary | ICD-10-CM

## 2018-08-21 NOTE — Patient Instructions (Signed)
Preparing for Vaginal Birth After Cesarean Delivery  Vaginal birth after cesarean delivery (VBAC) is giving birth vaginally after previously delivering a baby through a cesarean section (C-section). You and your health are provider will discuss your options and whether you may be a good candidate for VBAC.  What are my options?  After a cesarean delivery, your options for future deliveries may include:   Scheduled repeat cesarean delivery. This is done in a hospital with an operating room.   Trial of labor after cesarean (TOLAC). A successful TOLAC results in a vaginal delivery. If it is not successful, you will need to have a cesarean delivery. TOLAC should be attempted in facilities where an emergency cesarean delivery can be performed. It should not be done as a home birth.  Talk with your health care provider about the risks and benefits of each option early in your pregnancy. The best option for you will depend on your preferences and your overall health as well as your baby's.  What should I know about my past cesarean delivery?  It is important to know what type of incision was made in your uterus in a past cesarean delivery. The type of incision can affect the success of your TOLAC. Types of incisions include:   Low transverse. This is a side-to-side cut low on your uterus. The scar on your skin looks like a horizontal line just above your pubic area. This type of cut is the most common and makes you a good candidate for TOLAC.   Low vertical. This is an up-and-down cut low on your uterus. The scar on your skin looks like a vertical line between your pubic area and belly button. This type of cut puts you at higher risk for problems during TOLAC.   High vertical or classical. This is an up-and-down cut high on your uterus. The scar on your skin looks like a vertical line that runs over the top of your belly button. This type of cut has the highest risk for problems and usually means that TOLAC is not an  option.  When is VBAC not an option?  As you progress through your pregnancy, circumstances may change and you may need to reconsider your options. Your situation may also change even as you begin TOLAC. Your health care provider may not want you to attempt a VBAC if you:   Need to have labor started (induced) because your cervix is not ready for labor.   Have never had a vaginal delivery.   Have had more than two cesarean deliveries.   Are overdue.   Are pregnant with a very large baby.   Have a condition that causes high blood pressure (preeclampsia).  Questions to ask your health care provider   Am I a good candidate for TOLAC?   What are my chances of a successful vaginal delivery?   Is my preferred birth location equipped for a TOLAC?   What are my pain management options during a TOLAC?  Where to find more information   American Congress of Obstetricians and Gynecologists: www.acog.org   American College of Nurse-Midwives: www.midwife.org  Summary   Vaginal birth after cesarean delivery (VBAC) is giving birth vaginally after previously delivering a baby through a cesarean section (C-section).   VBAC may be a safe and appropriate option for you depending on your medical history and other risk factors. Talk with your health care provider about the options available to you, and the risks and benefits of each early   in your pregnancy.   TOLAC should be attempted in facilities where emergency cesarean section procedures can be performed.  This information is not intended to replace advice given to you by your health care provider. Make sure you discuss any questions you have with your health care provider.  Document Released: 04/30/2011 Document Revised: 11/21/2016 Document Reviewed: 11/21/2016  Elsevier Interactive Patient Education  2019 Elsevier Inc.

## 2018-08-21 NOTE — Progress Notes (Signed)
   PRENATAL VISIT NOTE  Subjective:  Jacqueline Bowen is a 32 y.o. [id number] at 4242w2d being seen today for ongoing prenatal care.  She is currently monitored for the following issues for this low-risk pregnancy and has History of cesarean section complicating pregnancy; Supervision of other normal pregnancy, antepartum; History of VBAC; and Left thyroid nodule on their problem list.  Patient reports no complaints.  Contractions: Irritability. Vag. Bleeding: None.  Movement: Present. Denies leaking of fluid.   The following portions of the patient's history were reviewed and updated as appropriate: allergies, current medications, past family history, past medical history, past social history, past surgical history and problem list. Problem list updated.  Objective:   Vitals:   08/21/18 0918  BP: 115/69  Pulse: 92  Weight: 187 lb 4.8 oz (85 kg)    Fetal Status: Fetal Heart Rate (bpm): 156 Fundal Height: 38 cm Movement: Present  Presentation: Vertex  General:  Alert, oriented and cooperative. Patient is in no acute distress.  Skin: Skin is warm and dry. No rash noted.   Cardiovascular: Normal heart rate noted  Respiratory: Normal respiratory effort, no problems with respiration noted  Abdomen: Soft, gravid, appropriate for gestational age.  Pain/Pressure: Present     Pelvic: Cervical exam performed Dilation: 1 Effacement (%): 50 Station: -3  Extremities: Normal range of motion.  Edema: None  Mental Status: Normal mood and affect. Normal behavior. Normal judgment and thought content.   Assessment and Plan:  Pregnancy: [id number] at 5842w2d  1. Supervision of other normal pregnancy, antepartum -doing well -membranes stripped today -NST/AFI next week & IOL scheduled for 41 wks  2. History of cesarean section complicating pregnancy -TOLAC consent on chart. Hx of successful VBAC  Term labor symptoms and general obstetric precautions including but not limited to vaginal bleeding,  contractions, leaking of fluid and fetal movement were reviewed in detail with the patient. Please refer to After Visit Summary for other counseling recommendations.  Return in about 1 week (around 08/28/2018) for Routine OB & NST/AFI.  No future appointments.  Judeth HornErin Kaeo Jacome, NP

## 2018-08-24 ENCOUNTER — Encounter (HOSPITAL_COMMUNITY): Payer: Self-pay | Admitting: *Deleted

## 2018-08-24 ENCOUNTER — Telehealth (HOSPITAL_COMMUNITY): Payer: Self-pay | Admitting: *Deleted

## 2018-08-24 NOTE — Telephone Encounter (Signed)
Preadmission screen  

## 2018-08-26 NOTE — L&D Delivery Note (Signed)
[facility]N [facility] Delivery Note  Jacqueline Bowen is a 33 y.o. [id number] s/p VBAC at [redacted]w[redacted]d. She was admitted for SROM, spontaneous onset of labor.   ROM: 34h 23m with clear fluid GBS Status: negative Maximum Maternal Temperature: Temp (24hrs), Avg:98.9 F (37.2 C), Min:98.1 F (36.7 C), Max:99.9 F (37.7 C)  Labor Progress: . SROM at 1300 08/28/18 . Progressed to 5-6cm without augmentation . Pitocin started around 0900 08/29/18, titrated per protocol . Epidural  . IUPC placed, multiple position changes and progressed to complete  Delivery Date/Time: 08/30/2018 Delivery: Called to room and patient was complete and pushing. Head delivered direct OA. No nuchal cord present but  Shoulder and body delivered in usual fashion. Infant with spontaneous cry, placed on mother's abdomen, dried and stimulated. Cord clamped x 2 after 1-minute delay, and cut by father of baby. Cord blood drawn. Placenta delivered spontaneously with gentle cord traction. Fundus firm with massage and Pitocin. Labia, perineum, vagina, and cervix inspected inspected with no lacerations.   Placenta: spontaneous, complete, 3-vessel cord Complications: terminal meconium  Lacerations: none EBL: 50cc Analgesia: epidural  Postpartum Planning [x]  message to sent to schedule follow-up  [x]  vaccines UTD - declined flu vaccine  Infant: vigorous female  APGARs 9, 9  weight pending  Everett Ehrler S. Earlene Plater, DO OB/GYN Fellow, Faculty Practice

## 2018-08-28 ENCOUNTER — Encounter (HOSPITAL_COMMUNITY): Payer: Self-pay

## 2018-08-28 ENCOUNTER — Inpatient Hospital Stay (HOSPITAL_COMMUNITY)
Admission: AD | Admit: 2018-08-28 | Discharge: 2018-08-31 | DRG: 807 | Disposition: A | Discharge status: Home / Self Care | Payer: Medicaid Other | Attending: Obstetrics & Gynecology | Admitting: Obstetrics & Gynecology

## 2018-08-28 DIAGNOSIS — Z98891 History of uterine scar from previous surgery: Secondary | ICD-10-CM

## 2018-08-28 DIAGNOSIS — O34211 Maternal care for low transverse scar from previous cesarean delivery: Secondary | ICD-10-CM | POA: Diagnosis present

## 2018-08-28 DIAGNOSIS — Z3483 Encounter for supervision of other normal pregnancy, third trimester: Secondary | ICD-10-CM | POA: Diagnosis present

## 2018-08-28 DIAGNOSIS — Z3A40 40 weeks gestation of pregnancy: Secondary | ICD-10-CM

## 2018-08-28 DIAGNOSIS — O34219 Maternal care for unspecified type scar from previous cesarean delivery: Secondary | ICD-10-CM

## 2018-08-28 DIAGNOSIS — Z3480 Encounter for supervision of other normal pregnancy, unspecified trimester: Secondary | ICD-10-CM

## 2018-08-28 DIAGNOSIS — O480 Post-term pregnancy: Secondary | ICD-10-CM | POA: Diagnosis not present

## 2018-08-28 LAB — TYPE AND SCREEN
ABO/RH(D): O POS
Antibody Screen: NEGATIVE

## 2018-08-28 LAB — CBC
HCT: 37.5 % (ref 36.0–46.0)
HEMOGLOBIN: 12.5 g/dL (ref 12.0–15.0)
MCH: 29 pg (ref 26.0–34.0)
MCHC: 33.3 g/dL (ref 30.0–36.0)
MCV: 87 fL (ref 80.0–100.0)
NRBC: 0 % (ref 0.0–0.2)
Platelets: 188 10*3/uL (ref 150–400)
RBC: 4.31 MIL/uL (ref 3.87–5.11)
RDW: 13.4 % (ref 11.5–15.5)
WBC: 6.8 10*3/uL (ref 4.0–10.5)

## 2018-08-28 LAB — POCT FERN TEST: POCT Fern Test: POSITIVE

## 2018-08-28 MED ORDER — LIDOCAINE HCL (PF) 1 % IJ SOLN
30.0000 mL | INTRAMUSCULAR | Status: DC | PRN
  Filled 2018-08-28: qty 30

## 2018-08-28 MED ORDER — FENTANYL CITRATE (PF) 100 MCG/2ML IJ SOLN
50.0000 ug | INTRAMUSCULAR | Status: DC | PRN
  Administered 2018-08-29 (×2): 50 ug via INTRAVENOUS
  Filled 2018-08-28 (×3): qty 2

## 2018-08-28 MED ORDER — LACTATED RINGERS IV SOLN
INTRAVENOUS | Status: DC
  Administered 2018-08-28 – 2018-08-29 (×3): via INTRAVENOUS

## 2018-08-28 MED ORDER — ONDANSETRON HCL 4 MG/2ML IJ SOLN: 4.0000 mg | Freq: Four times a day (QID) | INTRAMUSCULAR | Status: DC | PRN

## 2018-08-28 MED ORDER — SOD CITRATE-CITRIC ACID 500-334 MG/5ML PO SOLN: 30.0000 mL | ORAL | Status: DC | PRN

## 2018-08-28 MED ORDER — OXYCODONE-ACETAMINOPHEN 5-325 MG PO TABS: 2.0000 | ORAL_TABLET | ORAL | Status: DC | PRN

## 2018-08-28 MED ORDER — ACETAMINOPHEN 325 MG PO TABS: 650.0000 mg | ORAL_TABLET | ORAL | Status: DC | PRN

## 2018-08-28 MED ORDER — OXYTOCIN 40 UNITS IN LACTATED RINGERS INFUSION - SIMPLE MED
2.5000 [IU]/h | INTRAVENOUS | Status: DC
  Administered 2018-08-30 (×2): 2.5 [IU]/h via INTRAVENOUS
  Filled 2018-08-28: qty 1000

## 2018-08-28 MED ORDER — OXYCODONE-ACETAMINOPHEN 5-325 MG PO TABS: 1.0000 | ORAL_TABLET | ORAL | Status: DC | PRN

## 2018-08-28 MED ORDER — LACTATED RINGERS IV SOLN
500.0000 mL | INTRAVENOUS | Status: DC | PRN
  Administered 2018-08-29: 500 mL via INTRAVENOUS

## 2018-08-28 MED ORDER — OXYTOCIN BOLUS FROM INFUSION
500.0000 mL | Freq: Once | INTRAVENOUS | Status: AC
  Administered 2018-08-29: 500 mL via INTRAVENOUS

## 2018-08-28 NOTE — H&P (Addendum)
LABOR AND DELIVERY ADMISSION HISTORY AND PHYSICAL NOTE  Jacqueline Bowen is a 33 y.o. female [phone] with IUP at [redacted]w[redacted]d by LMP presenting for SROM at approx 1300 on 08/28/18.  She reports positive fetal movement. She denies leakage of fluid or vaginal bleeding.  Prenatal History/Complications: [facility] at [facility] Pregnancy complications:  - H/O C-Section, Desires TOLAC - Echogenic cardiac focus on 14 week U/S, repeat at 18 weeks normal  Past Medical History: Past Medical History:  Diagnosis Date  . Medical history non-contributory     Past Surgical History: Past Surgical History:  Procedure Laterality Date  . CESAREAN SECTION N/A 08/11/2013   Procedure: CESAREAN SECTION;  Surgeon: Jeani Hawking, MD;  Location: [facility];  Service: Obstetrics;  Laterality: N/A;    Obstetrical History: OB History    Gravida  4   Para  2   Term  2   Preterm  0   AB  1   Living  2     SAB  1   TAB  0   Ectopic  0   Multiple  0   Live Births  2           Social History: Social History   Socioeconomic History  . Marital status: Married    Spouse name: Not on file  . Number of children: Not on file  . Years of education: Not on file  . Highest education level: Not on file  Occupational History  . Not on file  Social Needs  . Financial resource strain: Not hard at all  . Food insecurity:    Worry: Never true    Inability: Never true  . Transportation needs:    Medical: No    Non-medical: Not on file  Tobacco Use  . Smoking status: Never Smoker  . Smokeless tobacco: Never Used  Substance and Sexual Activity  . Alcohol use: No  . Drug use: No  . Sexual activity: Yes    Birth control/protection: None    Comment: postpartum  Lifestyle  . Physical activity:    Days per week: Not on file    Minutes per session: Not on file  . Stress: Not at all  Relationships  . Social connections:    Talks on phone: Not on file    Gets together: Not on file     Attends religious service: Not on file    Active member of club or organization: Not on file    Attends meetings of clubs or organizations: Not on file    Relationship status: Not on file  Other Topics Concern  . Not on file  Social History Narrative  . Not on file    Family History: Family History  Problem Relation Age of Onset  . Heart attack Neg Hx     Allergies: No Known Allergies  Medications Prior to Admission  Medication Sig Dispense Refill Last Dose  . acetaminophen (TYLENOL) 160 MG/5ML elixir Take 15 mg/kg by mouth every 4 (four) hours as needed for fever.   Not Taking  . Prenatal Vit-Fe Fumarate-FA (PRENATAL MULTIVITAMIN) TABS tablet Take 1 tablet by mouth daily at 12 noon.   Taking     Review of Systems  All systems reviewed and negative except as stated in HPI  Physical Exam Blood pressure 131/71, pulse (!) 110, temperature 98.2 F (36.8 C), resp. rate 18, height 5\' 6"  (1.676 m), weight 85.3 kg, last menstrual period 11/19/2017, unknown if currently breastfeeding. General  appearance: well appearing 33 year old AA female in no acute distress Lungs: normal respiratory effort Heart: regular rate Abdomen: soft, non-tender; gravid, FH appropriate for GA Extremities: No calf swelling or tenderness Presentation: cephalic Fetal monitoring: cat 1 Uterine activity: Inadequate contractions every 4-6 minutes Dilation: 2.5 Effacement (%): 80 Station: -2  Prenatal labs: ABO, Rh: O/Positive/-- (06/18 9242) Antibody: Negative (06/18 0953) Rubella: 7.94 (06/18 0953) RPR: Non Reactive (10/21 0825)  HBsAg: Negative (06/18 0953)  HIV: Non Reactive (10/21 0825)  GC/Chlamydia: negative GBS:   Neg 1/fasting/2-hr GTT: 99/79/80 Genetic screening:  Deferred Anatomy US: Normal  Prenatal Transfer Tool  Maternal Diabetes: No Genetic Screening: Normal Maternal Ultrasounds/Referrals: Normal Fetal Ultrasounds or other Referrals:  Anatomy U/S performed 2/2 cardiac focus on  14 week [place] Maternal Substance Abuse:  No Significant Maternal Medications:  None Significant Maternal Lab Results: Lab values include: Group B Strep negative  No results found for this or any previous visit (from the past 24 hour(s)).  Patient Active Problem List   Diagnosis Date Noted  . History of VBAC 03/12/2018  . Supervision of other normal pregnancy, antepartum 02/10/2018  . Left thyroid nodule 04/01/2017  . History of cesarean section complicating pregnancy 11/22/2015    Assessment: Jacqueline Bowen is a 33 y.o. A8T4196 at 105w2d here for SROM. Patient has history of LTCS. Successful VBAC with second child. TOLAC consent in chart. Will manage expectantly at current, can likely add pitocin if fails to significantly dilate.  #Labor: expectant #Pain: epidural #FWB: Cat 1 #ID:  gbs neg #MOF: breast #MOC:IUD #Circ: Parents do not wish to know sex, circ if boy  [name] MD PGY-2 Family Medicine Resident 08/28/2018, 10:55 PM   CNM attestation:  I have seen and examined this patient; I agree with above documentation in the resident's note.   Jacqueline Bowen is a 33 y.o. [id number] here for SROM @ 1300 on 08/28/18 with ctx increasing in strength/frequency this evening  PE: BP (!) 146/79   Pulse (!) 117   Temp 98.2 F (36.8 C)   Resp 18   Ht 5\' 6"  (1.676 m)   Wt 85.3 kg   LMP 11/19/2017 (Exact Date)   BMI 30.34 kg/m   Resp: normal effort, no distress Abd: gravid  ROS, labs, PMH reviewed  Plan: Admit to [facility] Expectant management; can augment prn Anticipate successful VBAC  [name] CNM 08/29/2018, 12:56 AM

## 2018-08-28 NOTE — MAU Note (Signed)
Ctxs since this am with bloody show. Denies LOF.

## 2018-08-29 ENCOUNTER — Inpatient Hospital Stay (HOSPITAL_COMMUNITY): Payer: Medicaid Other | Admitting: Anesthesiology

## 2018-08-29 ENCOUNTER — Encounter (HOSPITAL_COMMUNITY): Payer: Self-pay | Admitting: *Deleted

## 2018-08-29 LAB — RPR: RPR Ser Ql: NONREACTIVE

## 2018-08-29 MED ORDER — FENTANYL CITRATE (PF) 100 MCG/2ML IJ SOLN: 100.0000 ug | INTRAMUSCULAR | Status: DC | PRN

## 2018-08-29 MED ORDER — TERBUTALINE SULFATE 1 MG/ML IJ SOLN
0.2500 mg | Freq: Once | INTRAMUSCULAR | Status: DC | PRN
  Filled 2018-08-29: qty 1

## 2018-08-29 MED ORDER — OXYTOCIN 40 UNITS IN LACTATED RINGERS INFUSION - SIMPLE MED
1.0000 m[IU]/min | INTRAVENOUS | Status: DC
  Administered 2018-08-29: 28 m[IU]/min via INTRAVENOUS
  Administered 2018-08-29: 2 m[IU]/min via INTRAVENOUS
  Filled 2018-08-29: qty 1000

## 2018-08-29 MED ORDER — FENTANYL 2.5 MCG/ML BUPIVACAINE 1/10 % EPIDURAL INFUSION (WH - ANES)
14.0000 mL/h | INTRAMUSCULAR | Status: DC | PRN
  Administered 2018-08-29 (×3): 14 mL/h via EPIDURAL
  Filled 2018-08-29 (×2): qty 100

## 2018-08-29 MED ORDER — EPHEDRINE 5 MG/ML INJ
10.0000 mg | INTRAVENOUS | Status: DC | PRN
  Filled 2018-08-29: qty 2

## 2018-08-29 MED ORDER — FENTANYL 2.5 MCG/ML BUPIVACAINE 1/10 % EPIDURAL INFUSION (WH - ANES)
INTRAMUSCULAR | Status: AC
  Filled 2018-08-29: qty 100

## 2018-08-29 MED ORDER — LACTATED RINGERS IV SOLN
500.0000 mL | Freq: Once | INTRAVENOUS | Status: AC
  Administered 2018-08-29: 500 mL via INTRAVENOUS

## 2018-08-29 MED ORDER — PHENYLEPHRINE 40 MCG/ML (10ML) SYRINGE FOR IV PUSH (FOR BLOOD PRESSURE SUPPORT)
80.0000 ug | PREFILLED_SYRINGE | INTRAVENOUS | Status: DC | PRN
  Filled 2018-08-29 (×2): qty 10

## 2018-08-29 MED ORDER — PHENYLEPHRINE 40 MCG/ML (10ML) SYRINGE FOR IV PUSH (FOR BLOOD PRESSURE SUPPORT)
80.0000 ug | PREFILLED_SYRINGE | INTRAVENOUS | Status: DC | PRN
  Filled 2018-08-29: qty 10

## 2018-08-29 MED ORDER — LIDOCAINE HCL (PF) 1 % IJ SOLN
INTRAMUSCULAR | Status: DC | PRN
  Administered 2018-08-29: 13 mL via EPIDURAL

## 2018-08-29 MED ORDER — DIPHENHYDRAMINE HCL 50 MG/ML IJ SOLN: 12.5000 mg | INTRAMUSCULAR | Status: DC | PRN

## 2018-08-29 NOTE — Progress Notes (Signed)
LABOR PROGRESS NOTE  Jacqueline Bowen is a 33 y.o. [id number] at [redacted]w[redacted]d  admitted for SROM.   Subjective: Patient feeling the urge to push. Feeling a lot of pressure.   Objective: BP 133/74   Pulse 93   Temp 98.7 F (37.1 C) (Oral)   Resp 16   Ht 5\' 6"  (1.676 m)   Wt 85.3 kg   LMP 11/19/2017 (Exact Date)   BMI 30.34 kg/m  or  Vitals:   08/29/18 1231 08/29/18 1301 08/29/18 1331 08/29/18 1401  BP: 117/68 (!) 117/59 126/79 133/74  Pulse: 86 89 100 93  Resp:      Temp:      TempSrc:      Weight:      Height:        Dilation: 8 Effacement (%): 80 Cervical Position: Anterior Station: -2 Presentation: Vertex Exam by:: lee FHT: baseline rate 120, moderate varibility, +acel, no decel Toco: q2-4 min   Labs: Lab Results  Component Value Date   WBC 6.8 08/28/2018   HGB 12.5 08/28/2018   HCT 37.5 08/28/2018   MCV 87.0 08/28/2018   PLT 188 08/28/2018    Patient Active Problem List   Diagnosis Date Noted  . Normal labor and delivery 08/28/2018  . History of VBAC 03/12/2018  . Supervision of other normal pregnancy, antepartum 02/10/2018  . Left thyroid nodule 04/01/2017  . History of cesarean section complicating pregnancy 11/22/2015    Assessment / Plan: 33 y.o. G4P2012 at 105w3d here for SROM on 1/3 at 1330.   Labor: TOLAC. Patient with good cervical progression and feeling pressure. Pitocin at 8 mu/min. Unable to reduce cervix with contraction/pushing.  Fetal Wellbeing:  Cat I  Pain Control:  Epidural in place.  Anticipated MOD:  VBAC. Anticipate delivery shortly.   Marcy Siren, D.O. OB Fellow  08/29/2018, 2:27 PM

## 2018-08-29 NOTE — Progress Notes (Signed)
OB/GYN Faculty Practice: Labor Progress Note  Subjective: Introduced self to patient and partner Jacqueline Bowen. Jacqueline Bowen states she is nervous about progress during labor.   Objective: BP 136/85   Pulse (!) 118   Temp 99.9 F (37.7 C) (Axillary)   Resp 19   Ht 5\' 6"  (1.676 m)   Wt 85.3 kg   LMP 11/19/2017 (Exact Date)   BMI 30.34 kg/m  Gen: well-appearing, NAD Dilation: 8.5 Effacement (%): 90 Cervical Position: Anterior Station: -1 Presentation: Vertex Exam by:: L.Jadore Mcguffin DO  Assessment and Plan: 33 y.o. [id number] 1837w3d admitted with SROM and spontaneous onset of labor 08/28/18 at 1300. Her pregnancy is complicated by C/S for NRFHTs with successful VBAC last pregnancy.   Labor: Admitted overnight with cervical exam 2.5/80/-2. Progressed to 5-6 overnight without augmentation. Started on pitocin this morning, about 12 hours ago for augmentation. Pitocin has been titrated throughout the day, IUPC now in place with inadequate MVUs. Has been 8cm since around 1400 or about 6 hours. Exam essentially unchanged - thick right side of cervix but reducible with bearing down and left side of cervix very posterior.  -- pitocin started around 0900  -- pain control: epidural in place -- PPH Risk: moderate -- BP elevation error - checked with contraction and poor position -- temperature elevated - will continue to monitor  Fetal Well-Being: EFW 8-9lbs. Cephalic by sutures.  -- Category I - continuous fetal monitoring - early decelerations, given bolus and repositioned -- GBS negative    Duyen Beckom S. Earlene PlaterWallace, DO OB/GYN Fellow, Faculty Practice  9:34 PM

## 2018-08-29 NOTE — Anesthesia Pain Management Evaluation Note (Signed)
  CRNA Pain Management Visit Note  Patient: Jacqueline Bowen, 33 y.o., female  "Hello I am a member of the anesthesia team at [facility]. We have an anesthesia team available at all times to provide care throughout the hospital, including epidural management and anesthesia for C-section. I don't know your plan for the delivery whether it a natural birth, water birth, IV sedation, nitrous supplementation, doula or epidural, but we want to meet your pain goals."   1.Was your pain managed to your expectations on prior hospitalizations?   Yes   2.What is your expectation for pain management during this hospitalization?     Epidural  3.How can we help you reach that goal?   Record the patient's initial score and the patient's pain goal.   Pain: 8  Pain Goal: 10 [facility] wants you to be able to say your pain was always managed very well.  Laban Emperor 08/29/2018

## 2018-08-29 NOTE — Anesthesia Preprocedure Evaluation (Signed)
Anesthesia Evaluation  Patient identified by MRN, date of birth, ID band Patient awake    Reviewed: Allergy & Precautions, NPO status , Patient's Chart, lab work & pertinent test results  History of Anesthesia Complications Negative for: history of anesthetic complications  Airway Mallampati: III  TM Distance: >3 FB Neck ROM: Full    Dental  (+) Teeth Intact   Pulmonary neg pulmonary ROS,    Pulmonary exam normal breath sounds clear to auscultation       Cardiovascular negative cardio ROS   Rhythm:Regular Rate:Normal     Neuro/Psych negative neurological ROS     GI/Hepatic negative GI ROS, Neg liver ROS,   Endo/Other  negative endocrine ROS  Renal/GU negative Renal ROS     Musculoskeletal   Abdominal   Peds  Hematology negative hematology ROS (+)   Anesthesia Other Findings H/o previous c-section on 08/11/2013  Reproductive/Obstetrics (+) Pregnancy                             Anesthesia Physical  Anesthesia Plan  ASA: II  Anesthesia Plan: Epidural   Post-op Pain Management:    Induction:   PONV Risk Score and Plan:   Airway Management Planned: Natural Airway  Additional Equipment:   Intra-op Plan:   Post-operative Plan:   Informed Consent: I have reviewed the patients History and Physical, chart, labs and discussed the procedure including the risks, benefits and alternatives for the proposed anesthesia with the patient or authorized representative who has indicated his/her understanding and acceptance.     Plan Discussed with:   Anesthesia Plan Comments: (I have discussed risks of neuraxial anesthesia including but not limited to infection, bleeding, nerve injury, back pain, headache, seizures, and failure of block. Patient denies bleeding disorders and is not currently anticoagulated. Labs have been reviewed. Risks and benefits discussed. All patient's questions  answered.   Platelets 193)        Anesthesia Quick Evaluation

## 2018-08-29 NOTE — Progress Notes (Addendum)
Patient ID: Jacqueline Bowen, female   DOB: 29-Apr-1986, 33 y.o.   MRN: [id number]  Breathing well w/ ctx  BP 124/75, P 110 FHR 135, +accels, no decels, Cat 1 Ctx 4-6 mins Cx 6/80/vtx -1  IUP@term  TOLAC Early active labor ROM x 16hrs  Continue expectant management Check cx in approx 2 hrs or sooner prn Anticipate VBAC  Arabella MerlesKimberly D Jewelianna Pancoast [facility] 08/29/2018 5:51 AM

## 2018-08-29 NOTE — Anesthesia Procedure Notes (Signed)
Epidural Patient location during procedure: OB Start time: 08/29/2018 8:03 AM End time: 08/29/2018 8:22 AM  Staffing Anesthesiologist: Lowella Curb, MD Performed: anesthesiologist   Preanesthetic Checklist Completed: patient identified, site marked, surgical consent, pre-op evaluation, timeout performed, IV checked, risks and benefits discussed and monitors and equipment checked  Epidural Patient position: sitting Prep: ChloraPrep Patient monitoring: heart rate, cardiac monitor, continuous pulse ox and blood pressure Approach: midline Location: L2-L3 Injection technique: LOR saline  Needle:  Needle type: Tuohy  Needle gauge: 17 G Needle length: 9 cm Needle insertion depth: 6 cm Catheter type: closed end flexible Catheter size: 20 Guage Catheter at skin depth: 10 cm Test dose: negative  Assessment Events: blood not aspirated, injection not painful, no injection resistance, negative IV test and no paresthesia  Additional Notes Reason for block:procedure for pain

## 2018-08-29 NOTE — Progress Notes (Signed)
LABOR PROGRESS NOTE  Jacqueline Bowen is a 34 y.o. P5W6568 at [redacted]w[redacted]d  admitted for SROM.   Subjective: Strip note. Discussed plan of care with RN.   Objective: BP 129/66   Pulse (!) 101   Temp 98.7 F (37.1 C) (Oral)   Resp 16   Ht 5\' 6"  (1.676 m)   Wt 85.3 kg   LMP 11/19/2017 (Exact Date)   BMI 30.34 kg/m  or  Vitals:   08/29/18 0901 08/29/18 0921 08/29/18 0943 08/29/18 0951  BP: (!) 94/38 (!) 112/57 129/66   Pulse: 95 (!) 106 (!) 101   Resp:    16  Temp:    98.7 F (37.1 C)  TempSrc:    Oral  Weight:      Height:        Dilation: 5 Effacement (%): 80 Cervical Position: Anterior Station: -2 Presentation: Vertex Exam by:: lee FHT: baseline rate 130, moderate varibility, +acel, no decel Toco: q5-7 min   Labs: Lab Results  Component Value Date   WBC 6.8 08/28/2018   HGB 12.5 08/28/2018   HCT 37.5 08/28/2018   MCV 87.0 08/28/2018   PLT 188 08/28/2018    Patient Active Problem List   Diagnosis Date Noted  . Normal labor and delivery 08/28/2018  . History of VBAC 03/12/2018  . Supervision of other normal pregnancy, antepartum 02/10/2018  . Left thyroid nodule 04/01/2017  . History of cesarean section complicating pregnancy 11/22/2015    Assessment / Plan: 33 y.o. G4P2012 at [redacted]w[redacted]d here for SROM on 1/3 at 1330.   Labor: TOLAC. Cervix remains unchanged for several hours. Will start Pitocin 2x2.  Fetal Wellbeing:  Cat I  Pain Control:  Epidural in place.  Anticipated MOD:  VBAC.   Marcy Siren, D.O. OB Fellow  08/29/2018, 10:36 AM

## 2018-08-29 NOTE — Progress Notes (Signed)
LABOR PROGRESS NOTE  Dayana Hergert is a 33 y.o. P5X4585 at [redacted]w[redacted]d  admitted for SROM.   Subjective: Still feeling pressure.   Objective: BP 134/77   Pulse (!) 111   Temp 98.8 F (37.1 C) (Oral)   Resp 18   Ht 5\' 6"  (1.676 m)   Wt 85.3 kg   LMP 11/19/2017 (Exact Date)   BMI 30.34 kg/m  or  Vitals:   08/29/18 1531 08/29/18 1601 08/29/18 1631 08/29/18 1701  BP: 117/62 120/74 123/65 134/77  Pulse: 93 98 (!) 102 (!) 111  Resp:      Temp:      TempSrc:      Weight:      Height:        Dilation: 8 Effacement (%): 80 Cervical Position: Anterior Station: -2 Presentation: Vertex Exam by:: lee FHT: baseline rate 135, moderate varibility, +acel, variable decel Toco: q2-4 min   Labs: Lab Results  Component Value Date   WBC 6.8 08/28/2018   HGB 12.5 08/28/2018   HCT 37.5 08/28/2018   MCV 87.0 08/28/2018   PLT 188 08/28/2018    Patient Active Problem List   Diagnosis Date Noted  . Normal labor and delivery 08/28/2018  . History of VBAC 03/12/2018  . Supervision of other normal pregnancy, antepartum 02/10/2018  . Left thyroid nodule 04/01/2017  . History of cesarean section complicating pregnancy 11/22/2015    Assessment / Plan: 33 y.o. G4P2012 at [redacted]w[redacted]d here for SROM on 1/3 at 1330.   Labor: TOLAC. Pitocin at 16 mu/min. Cervical exam remains unchanged. IUPC placed to better assess if ctx adequate and to adjust pitocin prn.  Fetal Wellbeing:  Cat II  Pain Control:  Epidural in place.  Anticipated MOD:  VBAC.  Marcy Siren, D.O. OB Fellow  08/29/2018, 5:37 PM

## 2018-08-30 ENCOUNTER — Encounter (HOSPITAL_COMMUNITY): Payer: Self-pay

## 2018-08-30 DIAGNOSIS — O480 Post-term pregnancy: Secondary | ICD-10-CM

## 2018-08-30 DIAGNOSIS — Z3A40 40 weeks gestation of pregnancy: Secondary | ICD-10-CM

## 2018-08-30 MED ORDER — SENNOSIDES-DOCUSATE SODIUM 8.6-50 MG PO TABS
2.0000 | ORAL_TABLET | ORAL | Status: DC
  Administered 2018-08-30: 2 via ORAL
  Filled 2018-08-30: qty 2

## 2018-08-30 MED ORDER — MEASLES, MUMPS & RUBELLA VAC IJ SOLR
0.5000 mL | Freq: Once | INTRAMUSCULAR | Status: DC
  Filled 2018-08-30: qty 0.5

## 2018-08-30 MED ORDER — DIBUCAINE 1 % RE OINT: 1.0000 "application " | TOPICAL_OINTMENT | RECTAL | Status: DC | PRN

## 2018-08-30 MED ORDER — ACETAMINOPHEN 325 MG PO TABS: 650.0000 mg | ORAL_TABLET | ORAL | Status: DC | PRN

## 2018-08-30 MED ORDER — WITCH HAZEL-GLYCERIN EX PADS: 1.0000 "application " | MEDICATED_PAD | CUTANEOUS | Status: DC | PRN

## 2018-08-30 MED ORDER — TETANUS-DIPHTH-ACELL PERTUSSIS 5-2.5-18.5 LF-MCG/0.5 IM SUSP: 0.5000 mL | Freq: Once | INTRAMUSCULAR | Status: DC

## 2018-08-30 MED ORDER — IBUPROFEN 600 MG PO TABS
600.0000 mg | ORAL_TABLET | Freq: Four times a day (QID) | ORAL | Status: DC
  Administered 2018-08-30 – 2018-08-31 (×5): 600 mg via ORAL
  Filled 2018-08-30 (×6): qty 1

## 2018-08-30 MED ORDER — ONDANSETRON HCL 4 MG PO TABS: 4.0000 mg | ORAL_TABLET | ORAL | Status: DC | PRN

## 2018-08-30 MED ORDER — DIPHENHYDRAMINE HCL 25 MG PO CAPS: 25.0000 mg | ORAL_CAPSULE | Freq: Four times a day (QID) | ORAL | Status: DC | PRN

## 2018-08-30 MED ORDER — COCONUT OIL OIL: 1.0000 "application " | TOPICAL_OIL | Status: DC | PRN

## 2018-08-30 MED ORDER — BENZOCAINE-MENTHOL 20-0.5 % EX AERO: 1.0000 "application " | INHALATION_SPRAY | CUTANEOUS | Status: DC | PRN

## 2018-08-30 MED ORDER — SIMETHICONE 80 MG PO CHEW: 80.0000 mg | CHEWABLE_TABLET | ORAL | Status: DC | PRN

## 2018-08-30 MED ORDER — ONDANSETRON HCL 4 MG/2ML IJ SOLN: 4.0000 mg | INTRAMUSCULAR | Status: DC | PRN

## 2018-08-30 MED ORDER — ZOLPIDEM TARTRATE 5 MG PO TABS: 5.0000 mg | ORAL_TABLET | Freq: Every evening | ORAL | Status: DC | PRN

## 2018-08-30 MED ORDER — PRENATAL MULTIVITAMIN CH
1.0000 | ORAL_TABLET | Freq: Every day | ORAL | Status: DC
  Administered 2018-08-30 – 2018-08-31 (×2): 1 via ORAL
  Filled 2018-08-30 (×2): qty 1

## 2018-08-30 NOTE — Anesthesia Postprocedure Evaluation (Signed)
Anesthesia Post Note  Patient: Triva Montey Hora  Procedure(s) Performed: AN AD HOC LABOR EPIDURAL     Patient location during evaluation: Mother Baby Anesthesia Type: Epidural Level of consciousness: awake and alert Pain management: pain level controlled Vital Signs Assessment: post-procedure vital signs reviewed and stable Respiratory status: spontaneous breathing Cardiovascular status: stable Postop Assessment: no headache, patient able to bend at knees, no apparent nausea or vomiting, no backache, epidural receding, adequate PO intake and able to ambulate Anesthetic complications: no    Last Vitals:  Vitals:   08/30/18 0135 08/30/18 0235  BP: 129/80 110/70  Pulse: 98 84  Resp: 18 18  Temp: 37.1 C 36.7 C    Last Pain:  Vitals:   08/30/18 0520  TempSrc:   PainSc: 5    Pain Goal: Patients Stated Pain Goal: 0 (08/29/18 0640)               Salome Arnt

## 2018-08-30 NOTE — Progress Notes (Signed)
Post Partum Day 1 Subjective: no complaints, she continues to have vaginal bleeding but it is improving. Abdomen minimally tender to palpation  Objective: Blood pressure 110/74, pulse 86, temperature 98.1 F (36.7 C), temperature source Oral, resp. rate 20, height 5\' 6"  (1.676 m), weight 85.3 kg, last menstrual period 11/19/2017, is currently breastfeeding  Physical Exam:  General: alert and no distress Lochia: appropriate Uterine Fundus: firm Incision: healing well DVT Evaluation: No evidence of DVT seen on physical exam. No cords or calf tenderness. No significant calf/ankle edema.  Recent Labs    08/28/18 2250  HGB 12.5  HCT 37.5   Assessment/Plan: Plan for discharge tomorrow and Breastfeeding  Planning for circumcision outpatient.   LOS: 2 days   Dollene Cleveland 08/30/2018, 10:16 AM

## 2018-08-30 NOTE — Lactation Note (Signed)
This note was copied from a baby's chart. Lactation Consultation Note  Patient Name: Jacqueline Bowen [id number] Date: 08/30/2018 Reason for consult: Initial assessment;Term  75 hours old FT female how is being exclusively BF by his mother, she's a P3 and experienced BF. She was able to BF her other two children for 12 months and she's already familiar with hand expression and able to get colostrum. Mom has LATCH scores of 10. She has a Medela DEBP at home but she doesn't feel very familiar with it, she prefers to use the hand pump instead, but doesn't have one. LC offered one from the hospital, pump instructions, cleaning and storage were reviewed as well as milk storage guidelines.  Offered assistance with latch but mom politely declined, baby was asleep and she didn't wish to disturb baby's sleep. Asked mom to call for latch assistance when needed. Discussed cluster feeding and normal newborn behavior.  Feeding plan:  1. Encouraged mom to feed baby STS 8-12 times/24 hours or sooner if feeding cues are present 2. Hand expression and spoon/finger feeding was also encouraged  BF brochure, BF resources and feeding diary were reviewed. Both parents are aware of LC services and will call PRN.  Maternal Data Formula Feeding for Exclusion: No Has patient been taught Hand Expression?: Yes Does the patient have breastfeeding experience prior to this delivery?: Yes  Feeding    Interventions Interventions: Breast feeding basics reviewed;Hand pump  Lactation Tools Discussed/Used Tools: Pump Breast pump type: Manual WIC Program: No Pump Review: Setup, frequency, and cleaning;Milk Storage Initiated by:: MPeck Date initiated:: 08/30/18   Consult Status Consult Status: PRN Date: 08/31/18 Follow-up type: In-patient    Amarachukwu Lakatos Venetia Constable 08/30/2018, 5:21 PM

## 2018-08-31 ENCOUNTER — Other Ambulatory Visit: Payer: Self-pay

## 2018-08-31 ENCOUNTER — Encounter: Payer: Self-pay | Admitting: Obstetrics and Gynecology

## 2018-08-31 MED ORDER — SENNOSIDES-DOCUSATE SODIUM 8.6-50 MG PO TABS: 2.0000 | ORAL_TABLET | ORAL | 0 refills | Status: AC

## 2018-08-31 MED ORDER — SIMETHICONE 80 MG PO CHEW: 80.0000 mg | CHEWABLE_TABLET | ORAL | 0 refills | Status: AC | PRN

## 2018-08-31 NOTE — Discharge Summary (Signed)
OB Discharge Summary     Patient Name: Jacqueline Bowen DOB: 25-Dec-1985 MRN: [id number]  Date of admission: 08/28/2018 Delivering MD: Tamera Stands   Date of discharge: 08/31/2018  Admitting diagnosis: 40wks CTXS bloody show Intrauterine pregnancy: [redacted]w[redacted]d     Secondary diagnosis:  Active Problems:   History of cesarean section complicating pregnancy   VBAC (vaginal birth after Cesarean)   Supervision of other normal pregnancy, antepartum   History of VBAC  Additional problems: none     Discharge diagnosis: Term Pregnancy Delivered and VBAC                                                                                                Post partum procedures:none  Augmentation: Pitocin  Complications: ROM>24 hours  Hospital course:  Onset of Labor With Vaginal Delivery     33 y.o. yo [id number] at [redacted]w[redacted]d was admitted in Latent Labor on 08/28/2018. Patient had an uncomplicated labor course as follows:  Membrane Rupture Time/Date: 1:30 PM ,08/28/2018   Intrapartum Procedures: Episiotomy: None [1]                                         Lacerations:  None [1]  Patient had a delivery of a Viable infant. 08/29/2018  Information for the patient's newborn:  Jacqueline, Bowen [[id number]]  Delivery Method: Vag-Spont    [name] had an uncomplicated postpartum course.  She is ambulating, tolerating a regular diet, passing flatus, and urinating well. Patient is discharged home in stable condition on 08/31/18.   Physical exam  Vitals:   08/30/18 1145 08/30/18 1546 08/30/18 2213 08/31/18 0545  BP: (!) 125/91 106/75 109/67 110/62  Pulse: 91 69 78 70  Resp: 18 17 18 18   Temp: (!) 97.5 F (36.4 C) 98.2 F (36.8 C) 97.9 F (36.6 C) 98.4 F (36.9 C)  TempSrc: Oral Oral Oral Oral  SpO2:   100%   Weight:      Height:       General: alert, cooperative and no distress Lochia: appropriate Uterine Fundus: firm Incision: N/A DVT Evaluation: No evidence of DVT seen on physical exam. No  significant calf/ankle edema. Labs: Lab Results  Component Value Date   WBC 6.8 08/28/2018   HGB 12.5 08/28/2018   HCT 37.5 08/28/2018   MCV 87.0 08/28/2018   PLT 188 08/28/2018   CMP Latest Ref Rng & Units 04/01/2017  Glucose 65 - 99 mg/dL 94  BUN 6 - 20 mg/dL 8  Creatinine 7.58 - 8.32 mg/dL 5.49  Sodium 826 - 415 mmol/L 138  Potassium 3.5 - 5.1 mmol/L 3.8  Chloride 101 - 111 mmol/L 101  CO2 22 - 32 mmol/L 27  Calcium 8.9 - 10.3 mg/dL 9.4  Total Protein 6.0 - 8.3 g/dL -  Total Bilirubin 0.3 - 1.2 mg/dL -  Alkaline Phos 39 - 830 U/L -  AST 0 - 37 U/L -  ALT 0 - 35 U/L -    Discharge instruction: per After Visit  Summary and "Baby and Me Booklet".  After visit meds:  Allergies as of 08/31/2018   No Known Allergies     Medication List    TAKE these medications   prenatal multivitamin Tabs tablet Take 1 tablet by mouth daily at 12 noon.   senna-docusate 8.6-50 MG tablet Commonly known as:  Senokot-S Take 2 tablets by mouth daily. Start taking on:  September 01, 2018   simethicone 80 MG chewable tablet Commonly known as:  MYLICON Chew 1 tablet (80 mg total) by mouth as needed for flatulence.       Diet: routine diet  Activity: Advance as tolerated. Pelvic rest for 6 weeks.   Outpatient follow up:4 weeks Follow up Appt: Future Appointments  Date Time Provider Department Center  09/23/2018  9:15 AM Marny LowensteinWenzel, Julie N, PA-C [facility]   Follow up Visit:No follow-ups on file.  Postpartum contraception: IUD [place]  Newborn Data: Live born female  Birth Weight: 7 lb 12 oz (3515 g) APGAR: 9, 9  Newborn Delivery   Birth date/time:  08/29/2018 23:43:00 Delivery type:  Vaginal, Spontaneous     Baby Feeding: Breast Disposition:home with mother   08/31/2018 Sharyon CableVeronica C Leonora Gores, CNM

## 2018-08-31 NOTE — Discharge Summary (Deleted)
Postpartum Discharge Summary     Patient Name: Jacqueline Bowen DOB: May 18, 1986 MRN: [id number]  Date of admission: 08/28/2018 Delivering Provider: Tamera Stands   Date of discharge: 08/31/2018  Admitting diagnosis: 40wks CTXS bloody show Intrauterine pregnancy: [redacted]w[redacted]d     Secondary diagnosis:  Active Problems:   History of cesarean section complicating pregnancy   VBAC (vaginal birth after Cesarean)   Supervision of other normal pregnancy, antepartum   History of VBAC  Additional problems: ROM 35 hours     Discharge diagnosis: Term Pregnancy Delivered                                                                                                Post partum procedures:None  Augmentation: Pitocin  Complications: None and ROM>24 hours  Hospital course:  Onset of Labor With Vaginal Delivery     32 [id number] at [redacted]w[redacted]d was admitted in Latent Labor on 08/28/2018. Patient had an uncomplicated labor course as follows:  Membrane Rupture Time/Date: 1:30 PM ,08/28/2018   Intrapartum Procedures: Episiotomy: None [1]                                         Lacerations:  None [1]  Patient had a delivery of a Viable infant. 08/29/2018  Information for the patient's newborn:  Jacqueline, Bowen [[id number]]  Delivery Method: Vag-Spont    [name] had an uncomplicated postpartum course.  She is ambulating, tolerating a regular diet, passing flatus, and urinating well. Patient is discharged home in stable condition on 08/31/18.   Magnesium Sulfate recieved: No BMZ received: No  Physical exam  Vitals:   08/30/18 1145 08/30/18 1546 08/30/18 2213 08/31/18 0545  BP: (!) 125/91 106/75 109/67 110/62  Pulse: 91 69 78 70  Resp: 18 17 18 18   Temp: (!) 97.5 F (36.4 C) 98.2 F (36.8 C) 97.9 F (36.6 C) 98.4 F (36.9 C)  TempSrc: Oral Oral Oral Oral  SpO2:   100%   Weight:      Height:       General: alert, cooperative and no distress Lochia: appropriate Uterine Fundus:  firm Incision: N/A DVT Evaluation: No evidence of DVT seen on physical exam. Negative Homan's sign. No cords or calf tenderness. No significant calf/ankle edema. Labs: Lab Results  Component Value Date   WBC 6.8 08/28/2018   HGB 12.5 08/28/2018   HCT 37.5 08/28/2018   MCV 87.0 08/28/2018   PLT 188 08/28/2018   CMP Latest Ref Rng & Units 04/01/2017  Glucose 65 - 99 mg/dL 94  BUN 6 - 20 mg/dL 8  Creatinine 5.73 - 2.20 mg/dL 2.54  Sodium 270 - 623 mmol/L 138  Potassium 3.5 - 5.1 mmol/L 3.8  Chloride 101 - 111 mmol/L 101  CO2 22 - 32 mmol/L 27  Calcium 8.9 - 10.3 mg/dL 9.4  Total Protein 6.0 - 8.3 g/dL -  Total Bilirubin 0.3 - 1.2 mg/dL -  Alkaline Phos 39 - 762 U/L -  AST 0 - 37 U/L -  ALT 0 - 35 U/L -    Discharge instruction: per After Visit Summary and "Baby and Me Booklet".  After visit meds:  Allergies as of 08/31/2018   No Known Allergies     Medication List    TAKE these medications   prenatal multivitamin Tabs tablet Take 1 tablet by mouth daily at 12 noon.   senna-docusate 8.6-50 MG tablet Commonly known as:  Senokot-S Take 2 tablets by mouth daily. Start taking on:  September 01, 2018   simethicone 80 MG chewable tablet Commonly known as:  MYLICON Chew 1 tablet (80 mg total) by mouth as needed for flatulence.       Diet: routine diet  Activity: Advance as tolerated. Pelvic rest for 6 weeks.   Outpatient follow up:6 weeks Follow up Appt:No future appointments.  Please schedule this patient for Postpartum visit in: 6 weeks with the following provider: MD For C/S patients schedule nurse incision check in weeks 2 weeks: no Low risk pregnancy complicated by: None Delivery mode:  VBAC Anticipated Birth Control:  IUD PP Procedures needed: None  Schedule Integrated BH visit: no  Newborn Data: Live born female  Birth Weight: 7 lb 12 oz (3515 g) APGAR: 9, 9  Newborn Delivery   Birth date/time:  08/29/2018 23:43:00 Delivery type:  Vaginal, Spontaneous      Baby Feeding: Breast Disposition:home with mother   08/31/2018 Dollene Cleveland, DO

## 2018-08-31 NOTE — Lactation Note (Signed)
This note was copied from a baby's chart. Lactation Consultation Note; Experienced BF mom reports this baby has been nursing well. LS by RN 10. Reports nipples are a little tender because he fed a lot through the night. Reassurance given Reports she is able to hand express a few drops of Colostrum. No questions at present. Reviewed our phone number, OP appointments and BFSG as resources for support after DC. To call prn  Patient Name: Jacqueline Bowen [id number] Date: 08/31/2018 Reason for consult: Follow-up assessment   Maternal Data Formula Feeding for Exclusion: No Has patient been taught Hand Expression?: Yes Does the patient have breastfeeding experience prior to this delivery?: Yes  Feeding    LATCH Score                   Interventions    Lactation Tools Discussed/Used     Consult Status Consult Status: Complete    Pamelia Hoit 08/31/2018, 10:10 AM

## 2018-09-02 ENCOUNTER — Inpatient Hospital Stay (HOSPITAL_COMMUNITY): Admission: RE | Admit: 2018-09-02 | Payer: Medicaid Other | Source: Ambulatory Visit

## 2018-09-23 ENCOUNTER — Encounter: Payer: Self-pay | Admitting: Medical

## 2018-09-23 ENCOUNTER — Ambulatory Visit (INDEPENDENT_AMBULATORY_CARE_PROVIDER_SITE_OTHER): Payer: Medicaid Other | Admitting: Medical

## 2018-09-23 DIAGNOSIS — Z30430 Encounter for insertion of intrauterine contraceptive device: Secondary | ICD-10-CM | POA: Diagnosis not present

## 2018-09-23 DIAGNOSIS — Z3040 Encounter for surveillance of contraceptives, unspecified: Secondary | ICD-10-CM

## 2018-09-23 DIAGNOSIS — Z1389 Encounter for screening for other disorder: Secondary | ICD-10-CM | POA: Diagnosis not present

## 2018-09-23 LAB — POCT PREGNANCY, URINE: Preg Test, Ur: NEGATIVE

## 2018-09-23 MED ORDER — LEVONORGESTREL 19.5 MCG/DAY IU IUD
INTRAUTERINE_SYSTEM | Freq: Once | INTRAUTERINE | Status: AC
  Administered 2018-09-23: 10:00:00 via INTRAUTERINE

## 2018-09-23 NOTE — Addendum Note (Signed)
Addended by: Henrietta Dine on: 09/23/2018 11:16 AM   Modules accepted: Orders

## 2018-09-23 NOTE — Progress Notes (Signed)
Subjective:     Jacqueline Bowen is a 33 y.o. female who presents for a postpartum visit. She is 3 weeks postpartum following a spontaneous vaginal delivery. I have fully reviewed the prenatal and intrapartum course. The delivery was at 40.3 gestational weeks. Outcome: spontaneous vaginal delivery. Anesthesia: epidural. Postpartum course has been normal. Baby's course has been normal. Baby is feeding by breast. Bleeding staining only. Bowel function is normal. Bladder function is normal. Patient is not sexually active. Contraception method is abstinence. Postpartum depression screening: negative.  The following portions of the patient's history were reviewed and updated as appropriate: allergies, current medications, past family history, past medical history, past social history, past surgical history and problem list.  Review of Systems Pertinent items are noted in HPI.   Objective:    BP 116/72   Pulse 79   General:  alert and cooperative   Breasts:  deferred  Lungs: clear to auscultation bilaterally  Heart:  regular rate and rhythm, S1, S2 normal, no murmur, click, rub or gallop  Abdomen: soft, non-tender; bowel sounds normal; no masses,  no organomegaly   Vulva:  normal  Vagina: normal vagina, no discharge, exudate, lesion, or erythema  Cervix:  multiparous appearance, no cervical motion tenderness and no lesions  Corpus: normal  Adnexa:  not evaluated  Rectal Exam: Not performed.         GYNECOLOGY CLINIC PROCEDURE NOTE  IUD Insertion Procedure Note Patient identified, informed consent performed.  Discussed risks of irregular bleeding, cramping, infection, malpositioning or misplacement of the IUD outside the uterus which may require further procedure such as laparoscopy. Time out was performed.  Urine pregnancy test negative.  Speculum placed in the vagina.  Cervix visualized.  Cleaned with Betadine x 2.  Grasped anteriorly with a single tooth tenaculum.  Uterus sounded to 8  cm.  Lilletta IUD placed per manufacturer's recommendations.  Strings trimmed to 3 cm. Tenaculum was removed, good hemostasis noted.  Patient tolerated procedure well.   Patient was given post-procedure instructions.  She was advised to be have backup contraception for one week.  Patient was also asked to check IUD strings periodically and follow up in 4 weeks for IUD check.    Assessment:     Normal postpartum exam. Pap smear not done at today's visit.  IUD insertion   Plan:    1. Contraception: IUD 2. Follow up in: 4 weeks for string check and annual exam with pap smear or sooner as needed.    Marny Lowenstein, PA-C 09/23/2018 10:30 AM

## 2018-09-23 NOTE — Patient Instructions (Signed)
Intrauterine Device Insertion, Care After  This sheet gives you information about how to care for yourself after your procedure. Your health care provider may also give you more specific instructions. If you have problems or questions, contact your health care provider. What can I expect after the procedure? After the procedure, it is common to have:  Cramps and pain in the abdomen.  Light bleeding (spotting) or heavier bleeding that is like your menstrual period. This may last for up to a few days.  Lower back pain.  Dizziness.  Headaches.  Nausea. Follow these instructions at home:  Before resuming sexual activity, check to make sure that you can feel the IUD string(s). You should be able to feel the end of the string(s) below the opening of your cervix. If your IUD string is in place, you may resume sexual activity. ? If you had a hormonal IUD inserted more than 7 days after your most recent period started, you will need to use a backup method of birth control for 7 days after IUD insertion. Ask your health care provider whether this applies to you.  Continue to check that the IUD is still in place by feeling for the string(s) after every menstrual period, or once a month.  Take over-the-counter and prescription medicines only as told by your health care provider.  Do not drive or use heavy machinery while taking prescription pain medicine.  Keep all follow-up visits as told by your health care provider. This is important. Contact a health care provider if:  You have bleeding that is heavier or lasts longer than a normal menstrual cycle.  You have a fever.  You have cramps or abdominal pain that get worse or do not get better with medicine.  You develop abdominal pain that is new or is not in the same area of earlier cramping and pain.  You feel lightheaded or weak.  You have abnormal or bad-smelling discharge from your vagina.  You have pain during sexual  activity.  You have any of the following problems with your IUD string(s): ? The string bothers or hurts you or your sexual partner. ? You cannot feel the string. ? The string has gotten longer.  You can feel the IUD in your vagina.  You think you may be pregnant, or you miss your menstrual period.  You think you may have an STI (sexually transmitted infection). Get help right away if:  You have flu-like symptoms.  You have a fever and chills.  You can feel that your IUD has slipped out of place. Summary  After the procedure, it is common to have cramps and pain in the abdomen. It is also common to have light bleeding (spotting) or heavier bleeding that is like your menstrual period.  Continue to check that the IUD is still in place by feeling for the string(s) after every menstrual period, or once a month.  Keep all follow-up visits as told by your health care provider. This is important.  Contact your health care provider if you have problems with your IUD string(s), such as the string getting longer or bothering you or your sexual partner. This information is not intended to replace advice given to you by your health care provider. Make sure you discuss any questions you have with your health care provider. Document Released: 04/10/2011 Document Revised: 07/03/2016 Document Reviewed: 07/03/2016 Elsevier Interactive Patient Education  2019 [employer]. IUD PLACEMENT POST-PROCEDURE INSTRUCTIONS  1. You may take Ibuprofen, Aleve or Tylenol  for pain if needed.  Cramping should resolve within in 24 hours.  2. You may have a small amount of spotting.  You should wear a mini pad for the next few days.  3. You may have intercourse after 24 hours.  If you using this for birth control, it is effective immediately.  4. You need to call if you have any pelvic pain, fever, heavy bleeding or foul smelling vaginal discharge.  Irregular bleeding is common the first several months after having an IUD  placed. You do not need to call for this reason unless you are concerned.  5. Shower or bathe as normal  6. You should have a follow-up appointment in 4-8 weeks for a re-check to make sure you are not having any problems. 

## 2018-10-13 ENCOUNTER — Encounter: Payer: Self-pay | Admitting: *Deleted

## 2018-10-21 ENCOUNTER — Ambulatory Visit (INDEPENDENT_AMBULATORY_CARE_PROVIDER_SITE_OTHER): Payer: Medicaid Other | Admitting: Medical

## 2018-10-21 ENCOUNTER — Encounter: Payer: Self-pay | Admitting: Medical

## 2018-10-21 ENCOUNTER — Other Ambulatory Visit (HOSPITAL_COMMUNITY)
Admission: RE | Admit: 2018-10-21 | Discharge: 2018-10-21 | Disposition: A | Discharge status: Home / Self Care | Payer: Medicaid Other | Source: Ambulatory Visit | Attending: Medical | Admitting: Medical

## 2018-10-21 VITALS — BP 119/78 | HR 86 | Ht 66.0 in | Wt 169.5 lb

## 2018-10-21 DIAGNOSIS — Z01419 Encounter for gynecological examination (general) (routine) without abnormal findings: Secondary | ICD-10-CM | POA: Diagnosis not present

## 2018-10-21 DIAGNOSIS — Z0000 Encounter for general adult medical examination without abnormal findings: Secondary | ICD-10-CM

## 2018-10-21 NOTE — Patient Instructions (Signed)
Pap Test  Why am I having this test?  A Pap test, also called a Pap smear, is a screening test to check for signs of:  · Cancer of the vagina, cervix, and uterus. The cervix is the lower part of the uterus that opens into the vagina.  · Infection.  · Changes that may be a sign that cancer is developing (precancerous changes).  Women need this test on a regular basis. In general, you should have a Pap test every 3 years until you reach menopause or age 34. Women aged 30-60 may choose to have their Pap test done at the same time as an HPV (human papillomavirus) test every 5 years (instead of every 3 years).  Your health care provider may recommend having Pap tests more or less often depending on your medical conditions and past Pap test results.  What kind of sample is taken?    Your health care provider will collect a sample of cells from the surface of your cervix. This will be done using a small cotton swab, plastic spatula, or brush. This sample is often collected during a pelvic exam, when you are lying on your back on an exam table with feet in footrests (stirrups).  In some cases, fluids (secretions) from the cervix or vagina may also be collected.  How do I prepare for this test?  · Be aware of where you are in your menstrual cycle. If you are menstruating on the day of the test, you may be asked to reschedule.  · You may need to reschedule if you have a known vaginal infection on the day of the test.  · Follow instructions from your health care provider about:  ? Changing or stopping your regular medicines. Some medicines can cause abnormal test results, such as digitalis and tetracycline.  ? Avoiding douching or taking a bath the day before or the day of the test.  Tell a health care provider about:  · Any allergies you have.  · All medicines you are taking, including vitamins, herbs, eye drops, creams, and over-the-counter medicines.  · Any blood disorders you have.  · Any surgeries you have had.  · Any  medical conditions you have.  · Whether you are pregnant or may be pregnant.  How are the results reported?  Your test results will be reported as either abnormal or normal.  A false-positive result can occur. A false positive is incorrect because it means that a condition is present when it is not.  A false-negative result can occur. A false negative is incorrect because it means that a condition is not present when it is.  What do the results mean?  A normal test result means that you do not have signs of cancer of the vagina, cervix, or uterus.  An abnormal result may mean that you have:  · Cancer. A Pap test by itself is not enough to diagnose cancer. You will have more tests done in this case.  · Precancerous changes in your vagina, cervix, or uterus.  · Inflammation of the cervix.  · An STD (sexually transmitted disease).  · A fungal infection.  · A parasite infection.  Talk with your health care provider about what your results mean.  Questions to ask your health care provider  Ask your health care provider, or the department that is doing the test:  · When will my results be ready?  · How will I get my results?  · What are my   treatment options?  · What other tests do I need?  · What are my next steps?  Summary  · In general, women should have a Pap test every 3 years until they reach menopause or age 34.  · Your health care provider will collect a sample of cells from the surface of your cervix. This will be done using a small cotton swab, plastic spatula, or brush.  · In some cases, fluids (secretions) from the cervix or vagina may also be collected.  This information is not intended to replace advice given to you by your health care provider. Make sure you discuss any questions you have with your health care provider.  Document Released: 11/02/2002 Document Revised: 04/21/2017 Document Reviewed: 04/21/2017  Elsevier Interactive Patient Education © 2019 [employer] Inc[employer]

## 2018-10-21 NOTE — Progress Notes (Signed)
Subjective:    Jacqueline Bowen is a 33 y.o. female who presents for an annual exam. The patient is also here for an IUD string check. The patient is not currently sexually active. GYN screening history: last pap: approximate date 2016 and was normal. The patient wears seatbelts: yes. The patient participates in regular exercise: not asked. Has the patient ever been transfused or tattooed?: not asked. The patient reports that there is not domestic violence in her life.   Menstrual History: OB History    Gravida  4   Para  3   Term  3   Preterm  0   AB  1   Living  3     SAB  1   TAB  0   Ectopic  0   Multiple  0   Live Births  3           Menarche age: unsure No LMP recorded.    The following portions of the patient's history were reviewed and updated as appropriate: allergies, current medications, past family history, past medical history, past social history, past surgical history and problem list.  Review of Systems Pertinent items are noted in HPI.    Objective:   Physical Exam  Nursing note and vitals reviewed. Constitutional: She is oriented to person, place, and time. She appears well-developed and well-nourished. No distress.  HENT:  Head: Normocephalic and atraumatic.  Eyes: EOM are normal.  Neck: Neck supple. No thyromegaly present.  Cardiovascular: Normal rate, regular rhythm and normal heart sounds.  No murmur heard. Respiratory: Effort normal and breath sounds normal. No respiratory distress. She has no wheezes.  GI: Soft. Bowel sounds are normal. She exhibits no distension and no mass. There is no abdominal tenderness. There is no rebound and no guarding.  Genitourinary: There is no rash, tenderness or lesion on the right labia. There is no rash, tenderness or lesion on the left labia. Uterus is not enlarged and not tender. Cervix exhibits no motion tenderness, no discharge and no friability. Right adnexum displays no mass and no tenderness. Left  adnexum displays no mass and no tenderness.    No vaginal discharge or bleeding.  No bleeding in the vagina.     Neurological: She is alert and oriented to person, place, and time.  Skin: Skin is warm and dry. No erythema.  Psychiatric: She has a normal mood and affect.     Assessment:    Healthy female exam.   IUD check up  Plan:     Await pap smear results.   Patient will be contacted with abnormal results  Return in 1 year for annual exam or sooner PRN   Marny Lowenstein, PA-C 10/21/2018 2:00 PM

## 2018-10-23 LAB — CYTOLOGY - PAP
Diagnosis: NEGATIVE
HPV: NOT DETECTED

## 2018-11-13 ENCOUNTER — Encounter: Payer: Self-pay | Admitting: *Deleted

## 2019-03-26 IMAGING — CT CT NECK W/ CM
5 of 6 series · 14 of 33 positions shown, 16 images · IV contrast (APPLIED)
Comparison: None.

CLINICAL DATA: Neck pain 3 days.  Neck swelling last week.  Fever

EXAM:
CT NECK WITH CONTRAST
TECHNIQUE: Multidetector CT imaging of the neck was performed using the
standard protocol following the bolus administration of intravenous
contrast.
CONTRAST:  75mL DVPT12-JUU IOPAMIDOL (DVPT12-JUU) INJECTION 61%

[Series 3: axial neck · axial · 0.45mm/px · z∈[-279,-199]mm · 2 of 121 slices shown, 3 images]
[im 41/121  soft-tissue]
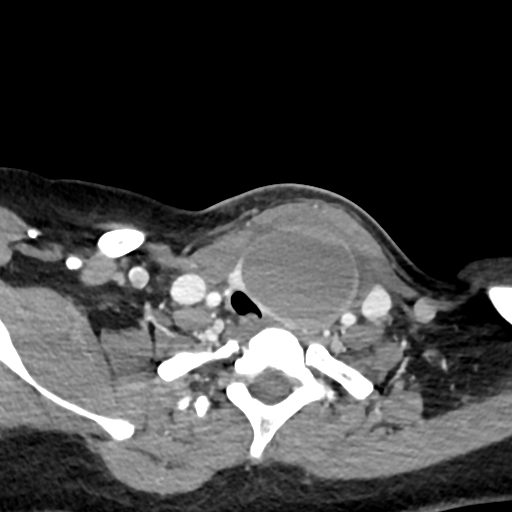
[im 41/121  bone]
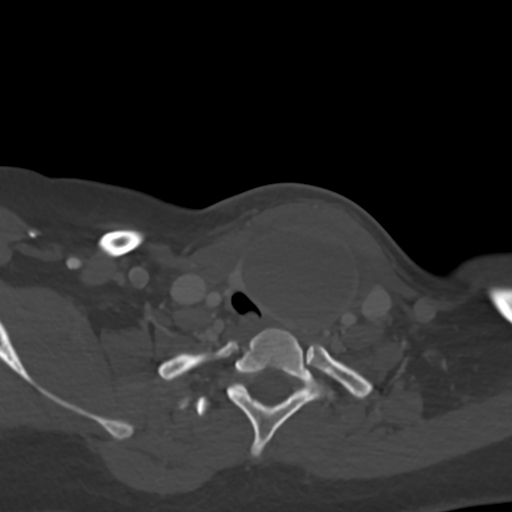
[im 81/121  bone]
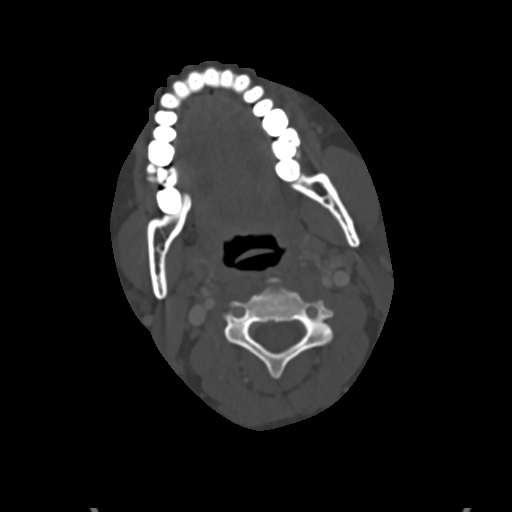

[Series 5: axial bone · axial · 0.45mm/px · z∈[-279,-199]mm · 2 of 121 slices shown]
[im 41/121  bone]
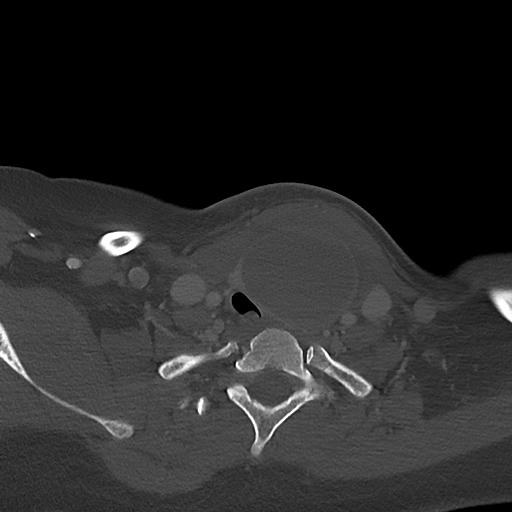
[im 81/121  bone]
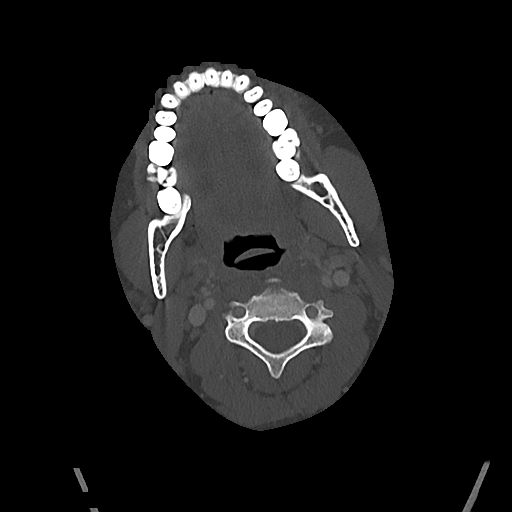

[Series 6: sag neck · sagittal · 0.50mm/px · 5 of 80 slices shown, 6 images]
[im 27/80  bone]
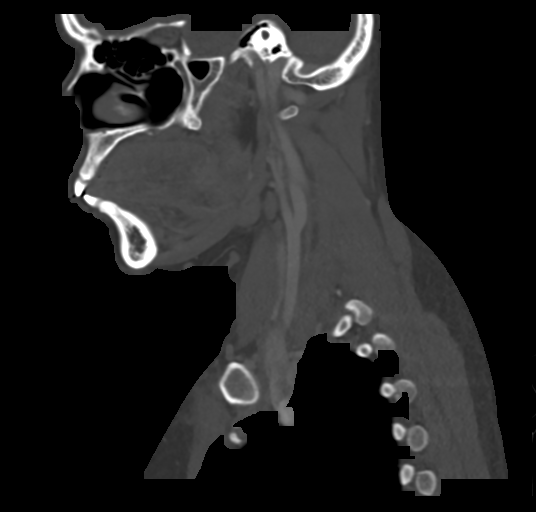
[im 33/80  bone]
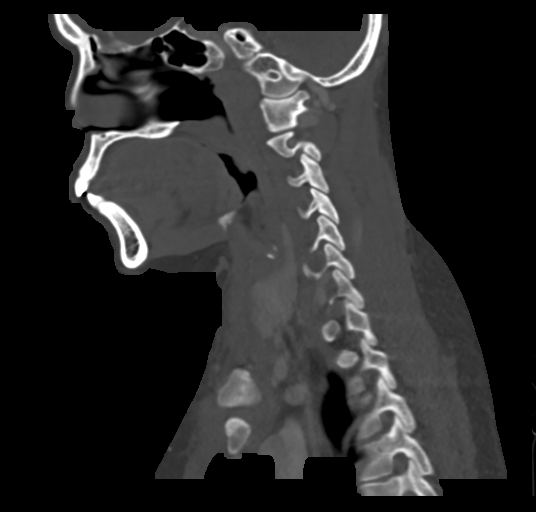
[im 40/80  soft-tissue]
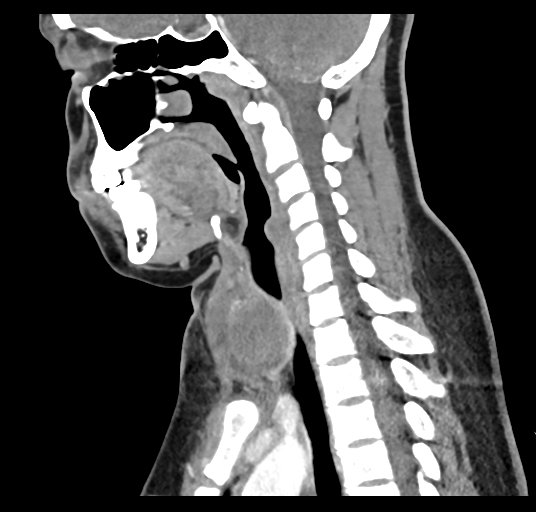
[im 40/80  bone]
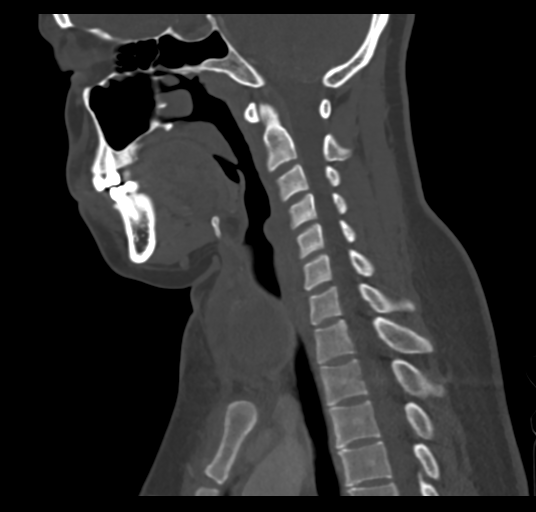
[im 47/80  bone]
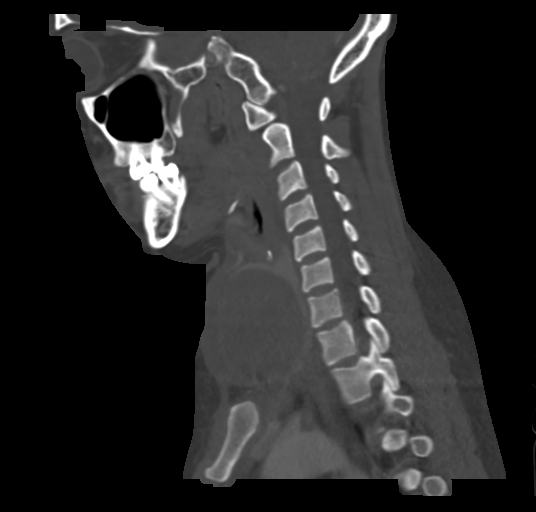
[im 53/80  bone]
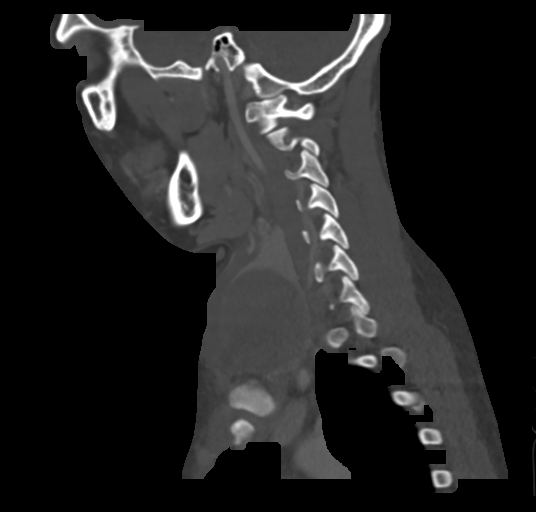

[Series 7: cor neck · coronal · 0.51mm/px · 3 of 95 slices shown]
[im 19/95  bone]
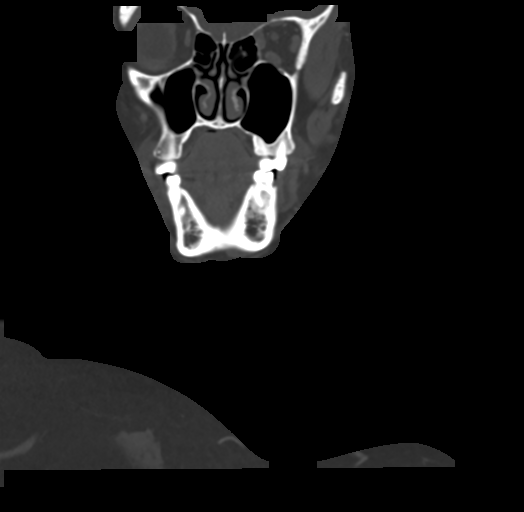
[im 38/95  bone]
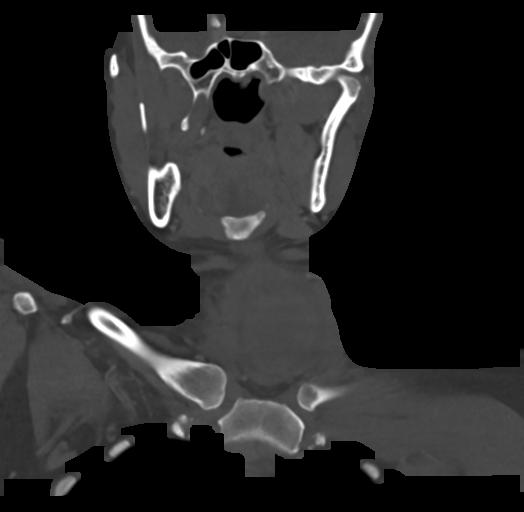
[im 57/95  bone]
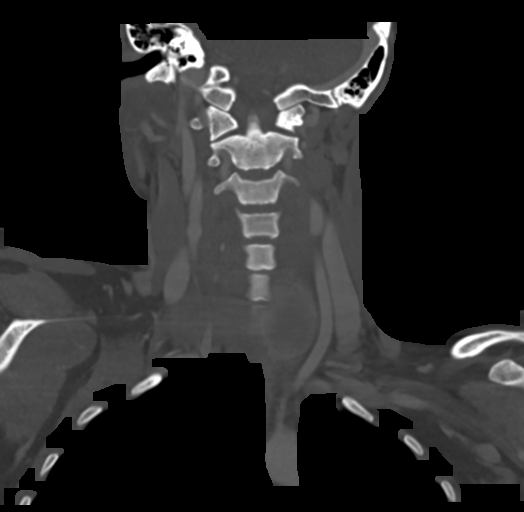

[Series 8: ax oropharynx · axial · 0.49mm/px · z∈[-300,-216]mm · 2 of 126 slices shown]
[im 42/126  bone]
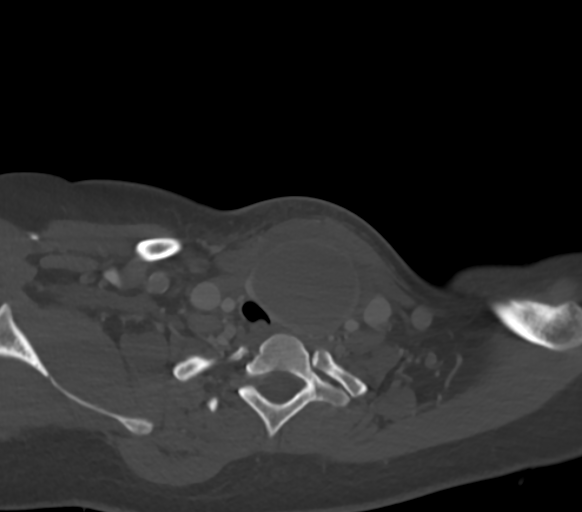
[im 84/126  bone]
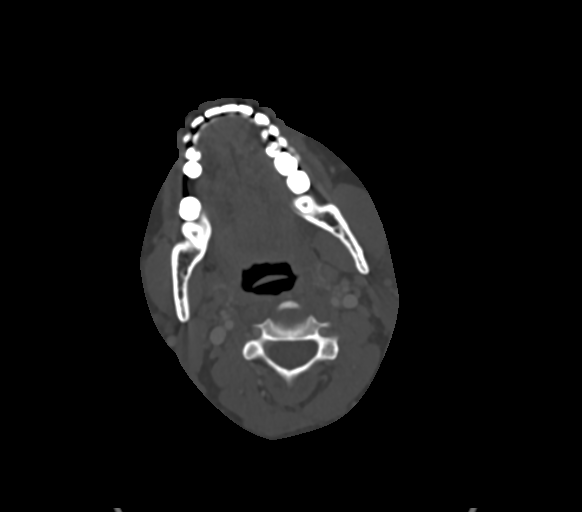

[14 of 33 positions shown; findings below may reference images not displayed]

FINDINGS: Pharynx and larynx: Normal. No mass or swelling.

Salivary glands: No inflammation, mass, or stone.

Thyroid: Large mass left lobe of thyroid measuring 5.4 x 4.3 x
cm. This has homogeneous fluid density and is surrounded by thin rim
of normal-appearing thyroid. There is some soft tissue edema
surrounding the mass. The trachea is deviated to the right and
mildly flattened without significant airway compromise. Small 5 mm
right thyroid nodule. Right thyroid lobe normal in size

Lymph nodes: Negative

Vascular: Negative

Limited intracranial: Negative

Visualized orbits: Negative

Mastoids and visualized paranasal sinuses: Negative

Skeleton: Negative

Upper chest: Negative

Other: None
IMPRESSION: Large fluid-filled mass left lobe of the thyroid with surrounding
edema. Given the history of sudden increase in size and pain, this
may represent hemorrhage into a thyroid adenoma or neoplasm. Abscess
seems less likely given the normal white count and lack of a thick
enhancing wall.

These results were called by telephone at the time of interpretation
on 04/01/2017 at [DATE] to BREMELIN FUENTED , who verbally acknowledged
these results.

## 2019-09-08 ENCOUNTER — Ambulatory Visit: Payer: Medicaid Other | Attending: Internal Medicine

## 2019-09-08 DIAGNOSIS — Z20822 Contact with and (suspected) exposure to covid-19: Secondary | ICD-10-CM | POA: Diagnosis not present

## 2019-09-10 LAB — NOVEL CORONAVIRUS, NAA: SARS-CoV-2, NAA: NOT DETECTED

## 2020-03-12 IMAGING — US US MFM OB DETAIL+14 WK
1 series · 13 of 28 positions shown · non-contrast
Comparison: none

[Series 1: us mfm ob detail+14 wk · 13 of 63 slices shown]
[im 3/63]
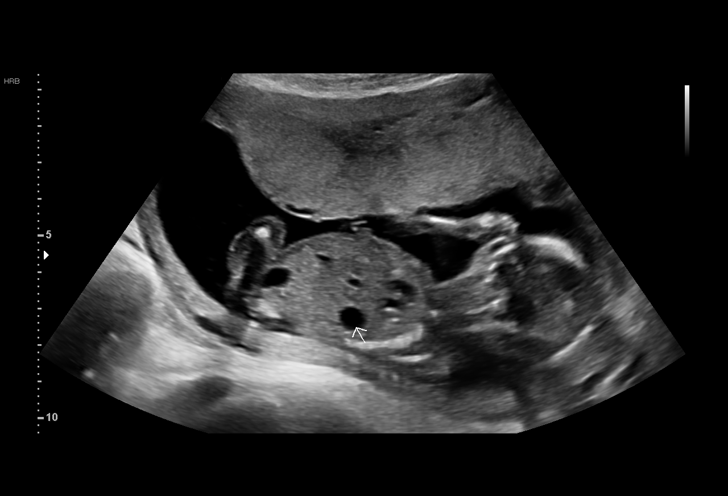
[im 7/63]
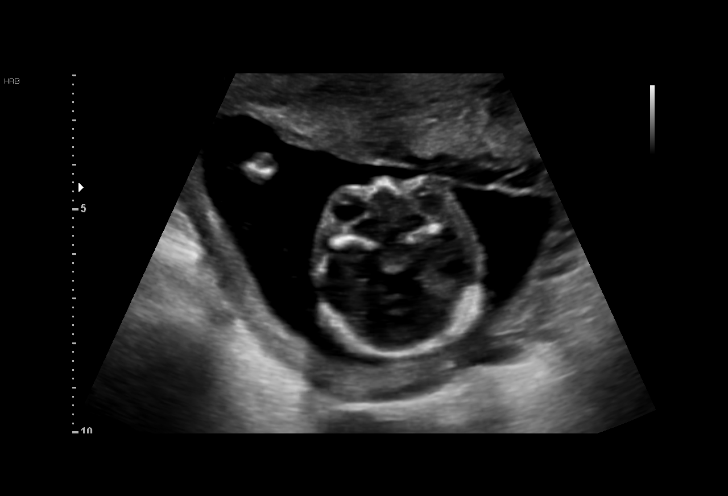
[im 12/63]
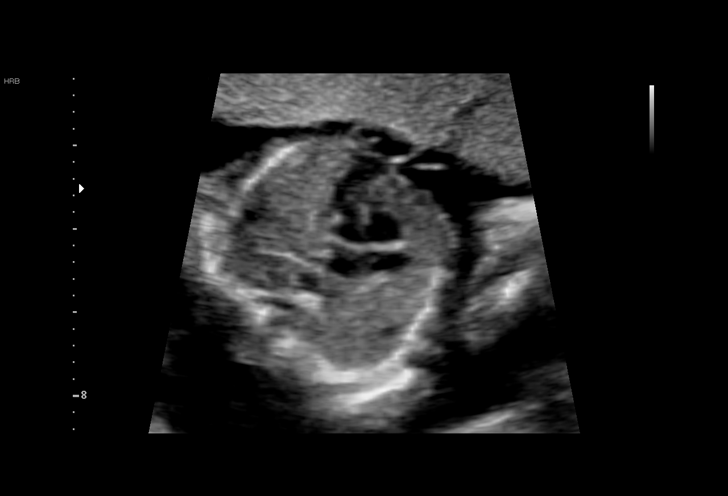
[im 17/63]
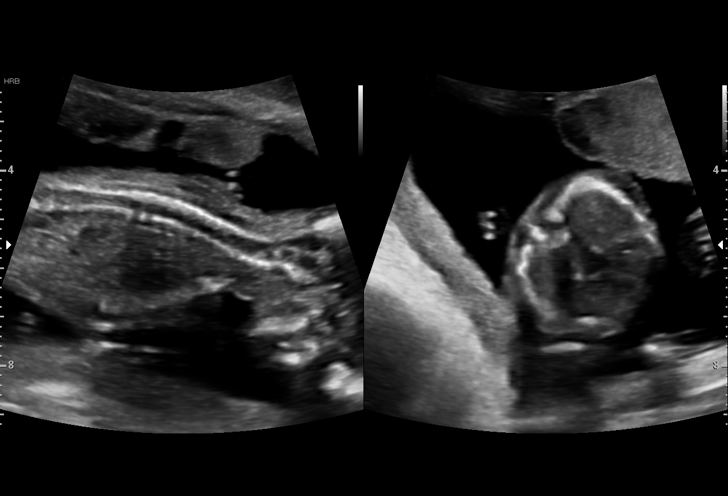
[im 21/63]
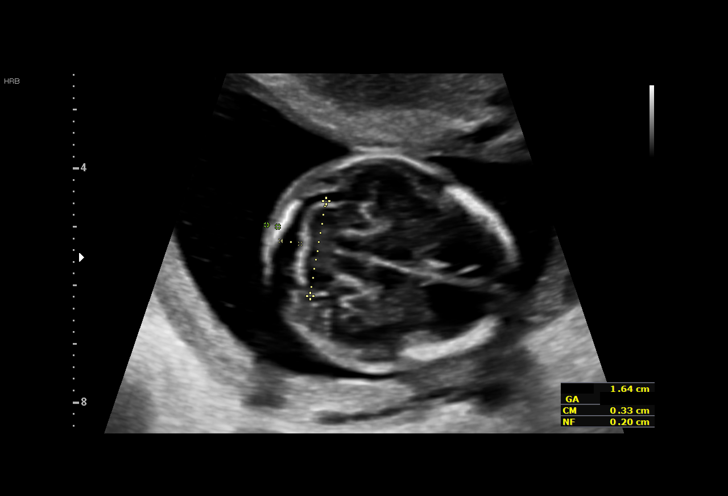
[im 26/63]
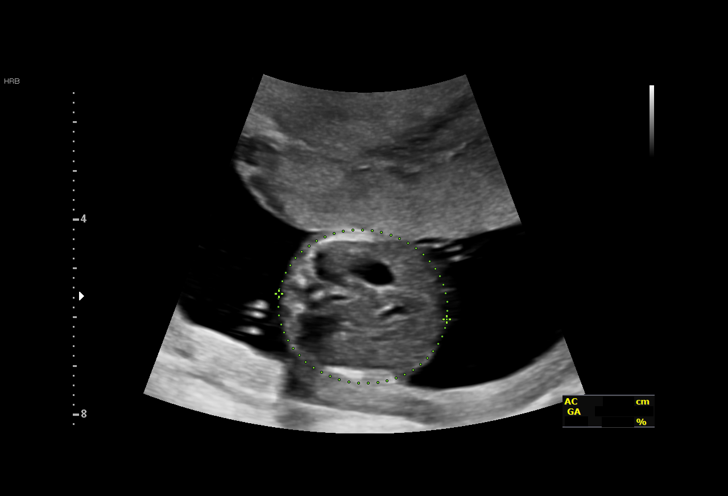
[im 33/63]
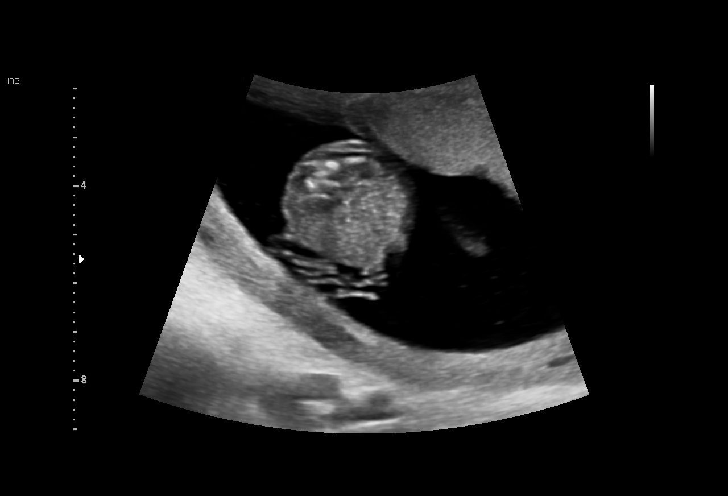
[im 37/63]
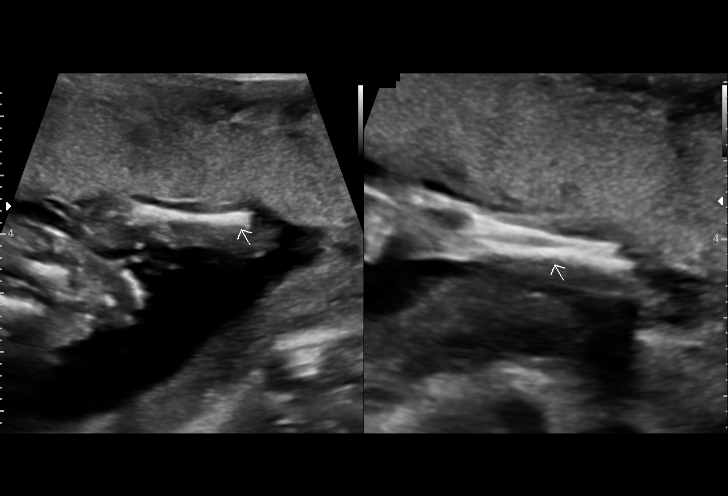
[im 42/63]
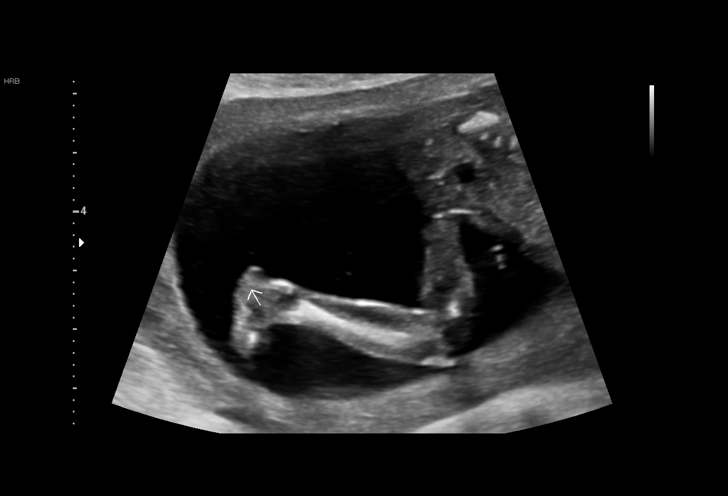
[im 46/63]
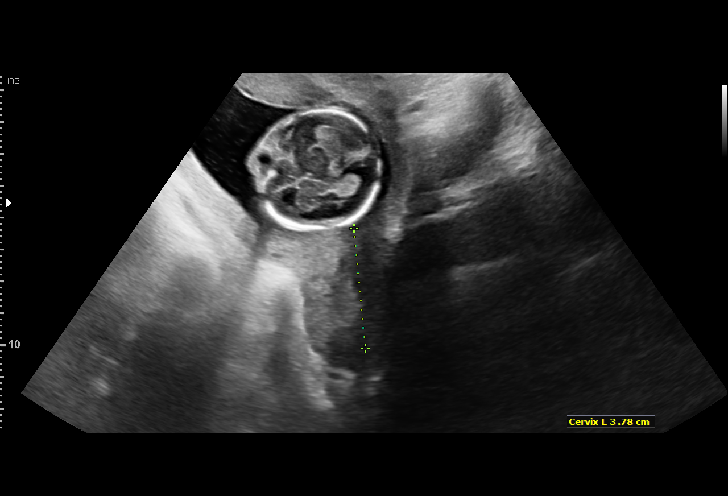
[im 51/63]
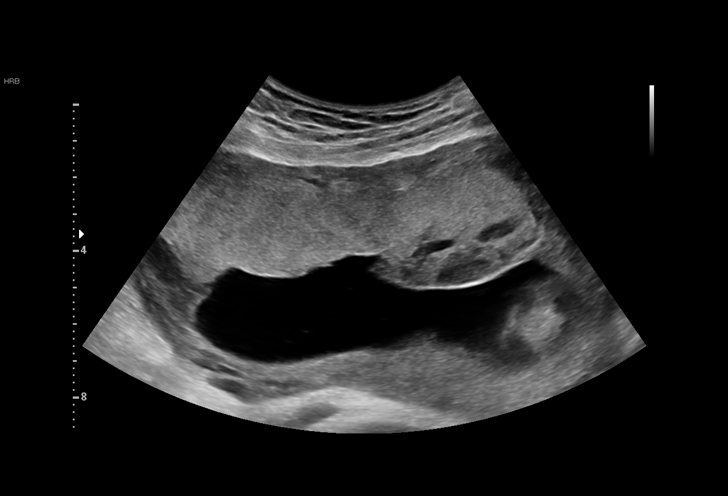
[im 56/63]
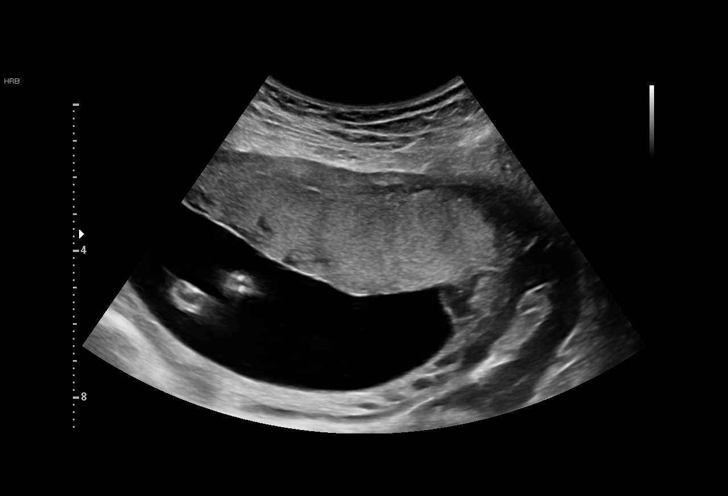
[im 60/63]
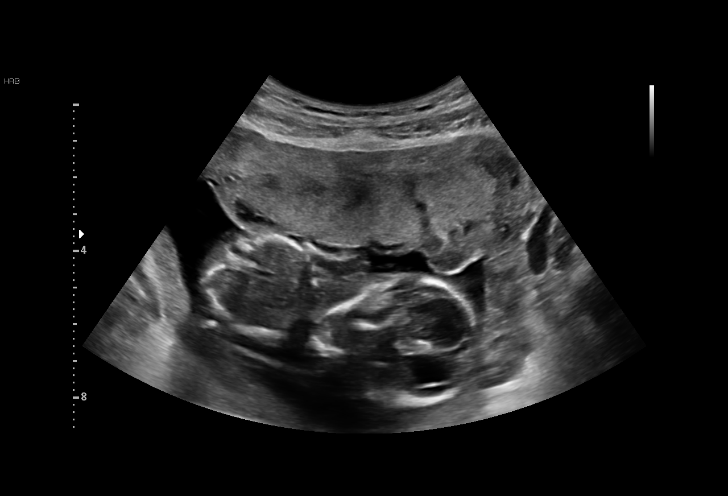

[13 of 28 positions shown; findings below may reference images not displayed]

1  AMADOR PERLMAN             [id number]      [id number]     [id number]
Indications

17 weeks gestation of pregnancy
Encounter for antenatal screening for
malformations
History of cesarean delivery, currently
pregnant
Fetal abnormality - other known or
suspected (EIF)
OB History

Blood Type:            Height:  5'6"   Weight (lb):  154       BMI:
Gravidity:    4         Term:   2         SAB:   1
Living:       2
Fetal Evaluation

Num Of Fetuses:     1
Fetal Heart         155
Rate(bpm):
Cardiac Activity:   Observed
Presentation:       Cephalic
Placenta:           Anterior
P. Cord Insertion:  Visualized, central

Amniotic Fluid
AFI FV:      Subjectively within normal limits

Largest Pocket(cm)
4.82
Biometry

BPD:        35  mm     G. Age:  16w 6d         32  %    CI:        83.19   %    70 - 86
FL/HC:      17.9   %    14.6 -
HC:       121   mm     G. Age:  16w 0d          3  %    HC/AC:      1.13        1.07 -
AC:      107.4  mm     G. Age:  16w 4d         33  %    FL/BPD:     62.0   %
FL:       21.7  mm     G. Age:  16w 4d         22  %    FL/AC:      20.2   %    20 - 24
HUM:      21.4  mm     G. Age:  16w 3d         38  %
CER:      16.4  mm     G. Age:  16w 3d         33  %
NFT:         2  mm
CM:        3.3  mm

Est. FW:     160  gm      0 lb 6 oz     42  %
Gestational Age

LMP:           17w 1d        Date:  11/19/17                 EDD:   08/26/18
U/S Today:     16w 4d                                        EDD:   08/30/18
Best:          17w 1d     Det. By:  LMP  (11/19/17)          EDD:   08/26/18
Anatomy

Cranium:               Appears normal         Aortic Arch:            Appears normal
Cavum:                 Appears normal         Ductal Arch:            Not well visualized
Ventricles:            Appears normal         Diaphragm:              Not well visualized
Choroid Plexus:        Appears normal         Stomach:                Appears normal, left
sided
Cerebellum:            Appears normal         Abdomen:                Appears normal
Posterior Fossa:       Appears normal         Abdominal Wall:         Appears nml (cord
insert, abd wall)
Nuchal Fold:           Appears normal         Cord Vessels:           Appears normal (3
vessel cord)
Face:                  Orbits nl; profile not Kidneys:                Appear normal
well visualized
Lips:                  Appears normal         Bladder:                Appears normal
Thoracic:              Appears normal         Spine:                  Appears normal
Heart:                 Echogenic focus        Upper Extremities:      Appears normal
in LV
RVOT:                  Not well visualized    Lower Extremities:      Appears normal
LVOT:                  Not well visualized

Other:  Parents do not wish to know sex of fetus. Heels visualized.
Technically difficult due to early gestational age.
Cervix Uterus Adnexa

Cervix
Length:           3.37  cm.
Normal appearance by transabdominal scan.

Uterus
No abnormality visualized.

Left Ovary
Not visualized. No adnexal mass visualized.

Right Ovary
Not visualized. No adnexal mass visualized.

Cul De Sac:   No free fluid seen.

Adnexa:       No abnormality visualized.
Impression

Patient is here for fetal anatomy scan. She had opted not to
screen for fetal aneuploidies.
We performed a fetal anatomy scan. An echogenic
intracardiac focus is seen in the left ventricle. No other
markers of aneuploidies or fetal structural defects are seen.
Fetal biometry is consistent with her previously-established
dates. Amniotic fluid is normal and good fetal activity is seen.
Patient understands the limitations of ultrasound in detecting
fetal anomalies.

Echogenic intracardiac focus: I counseled the patient that
echogenic focus is seen in about 3% to 4% of normal
fetuses, and in about 15%-20% of fetuses with Down
syndrome. She was reassured that echogenic focus is not
associated with any structural heart malformations. Presence
of this isolated marker only slightly increases the a priori risk
of Down syndrome.

I discussed the following options: 1) Maternal blood cell-free
fetal DNA screening  for trisomies 21, 18 and 13, which has a
greater detection rate than conventional screening tests. I
informed the patient that not all chromosomal malformations
are detected by this test. 2) I informed her that only
amniocentesis will give a definitive result on the fetal
karyotype. I discussed a procedure-related pregnancy loss of
about 1 in 400.

Patient and her husband informed that they will make a
decision after discussing between themselves.

Following structures are to be evaluated on her next visit:
-RVOT, LVOT, ductal arch.
-Diaphragm.
Recommendations

An appointment was made for her to return in 4 weeks for
completion of fetal anatomy.
